# Patient Record
Sex: Female | Born: 1970 | Race: Black or African American | Hispanic: No | Marital: Single | Smoking: Current some day smoker
Health system: Southern US, Community
[De-identification: ages and names within clinical notes are randomized; demographics above are authoritative.]

## PROBLEM LIST (undated history)

## (undated) DIAGNOSIS — T7840XA Allergy, unspecified, initial encounter: Secondary | ICD-10-CM

## (undated) DIAGNOSIS — I10 Essential (primary) hypertension: Secondary | ICD-10-CM

## (undated) DIAGNOSIS — E785 Hyperlipidemia, unspecified: Secondary | ICD-10-CM

## (undated) DIAGNOSIS — D649 Anemia, unspecified: Secondary | ICD-10-CM

## (undated) DIAGNOSIS — R7989 Other specified abnormal findings of blood chemistry: Secondary | ICD-10-CM

## (undated) DIAGNOSIS — E559 Vitamin D deficiency, unspecified: Secondary | ICD-10-CM

## (undated) HISTORY — DX: Anemia, unspecified: D64.9

## (undated) HISTORY — DX: Other specified abnormal findings of blood chemistry: R79.89

## (undated) HISTORY — DX: Allergy, unspecified, initial encounter: T78.40XA

## (undated) HISTORY — DX: Vitamin D deficiency, unspecified: E55.9

## (undated) HISTORY — PX: OTHER SURGICAL HISTORY: SHX169

## (undated) HISTORY — DX: Hyperlipidemia, unspecified: E78.5

## (undated) HISTORY — PX: NO PAST SURGERIES: SHX2092

## (undated) HISTORY — DX: Essential (primary) hypertension: I10

---
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
## 2004-10-16 ENCOUNTER — Ambulatory Visit: Payer: Self-pay | Admitting: Internal Medicine

## 2012-12-30 LAB — HM PAP SMEAR: HM PAP: NORMAL

## 2016-03-20 ENCOUNTER — Telehealth: Payer: Self-pay | Admitting: Behavioral Health

## 2016-03-20 ENCOUNTER — Encounter: Payer: Self-pay | Admitting: Behavioral Health

## 2016-03-20 NOTE — Telephone Encounter (Signed)
Pre-Visit Call completed with patient and chart updated.   Pre-Visit Info documented in Specialty Comments under SnapShot.    

## 2016-03-21 ENCOUNTER — Ambulatory Visit (INDEPENDENT_AMBULATORY_CARE_PROVIDER_SITE_OTHER): Payer: BLUE CROSS/BLUE SHIELD | Admitting: Family Medicine

## 2016-03-21 ENCOUNTER — Encounter: Payer: Self-pay | Admitting: Family Medicine

## 2016-03-21 VITALS — BP 158/102 | HR 87 | Temp 98.2°F | Ht 67.5 in | Wt 176.6 lb

## 2016-03-21 DIAGNOSIS — I10 Essential (primary) hypertension: Secondary | ICD-10-CM

## 2016-03-21 DIAGNOSIS — Z91030 Bee allergy status: Secondary | ICD-10-CM

## 2016-03-21 DIAGNOSIS — Z131 Encounter for screening for diabetes mellitus: Secondary | ICD-10-CM | POA: Diagnosis not present

## 2016-03-21 DIAGNOSIS — Z130 Encounter for screening for diseases of the blood and blood-forming organs and certain disorders involving the immune mechanism: Secondary | ICD-10-CM | POA: Diagnosis not present

## 2016-03-21 DIAGNOSIS — Z13220 Encounter for screening for lipoid disorders: Secondary | ICD-10-CM | POA: Diagnosis not present

## 2016-03-21 DIAGNOSIS — Z634 Disappearance and death of family member: Secondary | ICD-10-CM

## 2016-03-21 DIAGNOSIS — D649 Anemia, unspecified: Secondary | ICD-10-CM

## 2016-03-21 MED ORDER — EPINEPHRINE 0.3 MG/0.3ML IJ SOAJ: 0.3000 mg | INTRAMUSCULAR | Status: DC | PRN

## 2016-03-21 MED ORDER — HYDROCHLOROTHIAZIDE 12.5 MG PO TABS: 12.5000 mg | ORAL_TABLET | Freq: Every day | ORAL | Status: DC

## 2016-03-21 NOTE — Progress Notes (Addendum)
[place] [facility] [address], [place], [place] [address] [phone]  Date:  03/21/2016   Name:  Helen Avila   DOB:  1971/12/20   MRN:  [id number]  PCP:  Lamar Blinks, MD    Chief Complaint: New Patient (Initial Visit)   History of Present Illness:  Helen Avila is a 45 y.o. very pleasant female patient who presents with the following:  Here today to res-establish care.  She got her health insurance back She has a history of HTN.   Her daughter died 5 years ago- her name was Windsor and she died from complications from her DM1. She left behind a daughter of her own who pt is raising (currently 44 yo)  Admits that she is just now feeling like she wants to take care of herself again which is why she is here today  She had been on HCTZ for her BP in the past This did work well for her, no SE noted She has had the HTN since about the time when her daughter passed.   She does not have any sx of her HTN- no HA, no chest pain, no SOB She has never had any heart problems.   There is an extensive family history of HTN  She does customer service work, non smoker non driver  She is single at this time. Her youngest daughter is 17 years old now, granddaughter 45 yo.   She last ate 5-6 hours ago    There are no active problems to display for this patient.   Past Medical History  Diagnosis Date  . Hypertension   . Anemia   . Low serum vitamin D   . Vitamin D deficiency     Past Surgical History  Procedure Laterality Date  . No past surgeries      Social History  Substance Use Topics  . Smoking status: Never Smoker   . Smokeless tobacco: Never Used  . Alcohol Use: 0.0 oz/week    0 Standard drinks or equivalent per week     Comment: wine once month    Family History  Problem Relation Age of Onset  . COPD Mother   . Congestive Heart Failure Mother   . Diabetes Daughter   . Cancer Maternal Aunt      Breast cancer  . Cancer Paternal Grandmother     Breast cancer  . Hypertension Neg Hx   . Liver disease Father     Allergies  Allergen Reactions  . Bee Venom Swelling  . Shellfish Allergy Swelling    Medication list has been reviewed and updated.  Current Outpatient Prescriptions on File Prior to Visit  Medication Sig Dispense Refill  . EPINEPHrine 0.3 mg/0.3 mL IJ SOAJ injection Inject 0.3 mg into the muscle as needed.     No current facility-administered medications on file prior to visit.    Review of Systems:  As per HPI- otherwise negative.   Physical Examination: Filed Vitals:   03/21/16 1435 03/21/16 1446  BP: 167/109 158/102  Pulse: 87   Temp: 98.2 F (36.8 C)    Filed Vitals:   03/21/16 1435  Height: 5' 7.5" (1.715 m)  Weight: 176 lb 9.6 oz (80.105 kg)   Body mass index is 27.24 kg/(m^2). Ideal Body Weight: Weight in (lb) to have BMI = 25: 161.7  GEN: WDWN, NAD, Non-toxic, A & O x 3, looks well HEENT: Atraumatic, Normocephalic. Neck supple.  No masses, No LAD. Ears and Nose: No external deformity. CV: RRR, No M/G/R. No JVD. No thrill. No extra heart sounds. PULM: CTA B, no wheezes, crackles, rhonchi. No retractions. No resp. distress. No accessory muscle use. EXTR: No c/c/e NEURO Normal gait.  PSYCH: Normally interactive. Conversant. Not depressed or anxious appearing.  Calm demeanor.    Assessment and Plan: Essential hypertension - Plan: TSH, hydrochlorothiazide (HYDRODIURIL) 12.5 MG tablet  Screening for hyperlipidemia - Plan: Lipid panel  Screening for diabetes mellitus - Plan: Comprehensive metabolic panel, Hemoglobin A1c  Screening for deficiency anemia - Plan: CBC  Bee allergy status - Plan: EPINEPHrine 0.3 mg/0.3 mL IJ SOAJ injection  Loss or death of child  Here today to establish care and get back on tx for her HTN Will check labs today and start her on hctz 12.5 mg. Plan to recheck in 3 months for labs and BP check, if she is  able to check her BP in the ambulatory setting that would also be great, goal < 140/90 Screening labs as above Refilled her epipen She is not currently attending any support group for bereaved parents but would be interested in this- suggested the compassionate friends or heartstrings and she will look into these options. She is not currently suffering from depression or acute grief  Signed Lamar Blinks, MD  Received her labs and called on 3/24-  Results for orders placed or performed in visit on 03/21/16  CBC  Result Value Ref Range   WBC 6.5 4.0 - 10.5 K/uL   RBC 3.62 (L) 3.87 - 5.11 Mil/uL   Platelets 403.0 (H) 150.0 - 400.0 K/uL   Hemoglobin 9.5 (L) 12.0 - 15.0 g/dL   HCT 29.9 (L) 36.0 - 46.0 %   MCV 82.6 78.0 - 100.0 fl   MCHC 31.9 30.0 - 36.0 g/dL   RDW 16.2 (H) 11.5 - 15.5 %  Comprehensive metabolic panel  Result Value Ref Range   Sodium 136 135 - 145 mEq/L   Potassium 3.9 3.5 - 5.1 mEq/L   Chloride 105 96 - 112 mEq/L   CO2 25 19 - 32 mEq/L   Glucose, Bld 67 (L) 70 - 99 mg/dL   BUN 8 6 - 23 mg/dL   Creatinine, Ser 0.73 0.40 - 1.20 mg/dL   Total Bilirubin 0.4 0.2 - 1.2 mg/dL   Alkaline Phosphatase 76 39 - 117 U/L   AST 15 0 - 37 U/L   ALT 9 0 - 35 U/L   Total Protein 7.1 6.0 - 8.3 g/dL   Albumin 3.8 3.5 - 5.2 g/dL   Calcium 8.9 8.4 - 10.5 mg/dL   GFR 111.35 >60.00 mL/min  Lipid panel  Result Value Ref Range   Cholesterol 224 (H) 0 - 200 mg/dL   Triglycerides 107.0 0.0 - 149.0 mg/dL   HDL 57.20 >39.00 mg/dL   VLDL 21.4 0.0 - 40.0 mg/dL   LDL Cholesterol 145 (H) 0 - 99 mg/dL   Total CHOL/HDL Ratio 4    NonHDL 166.61   TSH  Result Value Ref Range   TSH 0.95 0.35 - 4.50 uIU/mL  Hemoglobin A1c  Result Value Ref Range   Hgb A1c MFr Bld 5.2 4.6 - 6.5 %   Labs are fine except for anemia- she has heard this before.  Admits to heavy menses that last about 4 days.  Will set up a pelvic US to eval for fibroids.  She will start an MVI with iron.  Will likely need to  redraw blood unless I can add on a ferritin for her

## 2016-03-21 NOTE — Patient Instructions (Signed)
It was very nice to see you today I will be in touch with your labs asap! Start on the hctz once a day- morning is probably best  Let's plan to recheck in 3 months   If you are interested, the Compassionate Friends group offers support for bereaved parents. You can find a chapter near you online

## 2016-03-22 ENCOUNTER — Encounter: Payer: Self-pay | Admitting: Family Medicine

## 2016-03-22 LAB — LIPID PANEL
CHOL/HDL RATIO: 4
Cholesterol: 224 mg/dL — ABNORMAL HIGH (ref 0–200)
HDL: 57.2 mg/dL (ref >39.00)
LDL CALC: 145 mg/dL — AB (ref 0–99)
NONHDL: 166.61
Triglycerides: 107 mg/dL (ref 0.0–149.0)
VLDL: 21.4 mg/dL (ref 0.0–40.0)

## 2016-03-22 LAB — COMPREHENSIVE METABOLIC PANEL
ALT: 9 U/L (ref 0–35)
AST: 15 U/L (ref 0–37)
Albumin: 3.8 g/dL (ref 3.5–5.2)
Alkaline Phosphatase: 76 U/L (ref 39–117)
BILIRUBIN TOTAL: 0.4 mg/dL (ref 0.2–1.2)
BUN: 8 mg/dL (ref 6–23)
CHLORIDE: 105 meq/L (ref 96–112)
CO2: 25 meq/L (ref 19–32)
CREATININE: 0.73 mg/dL (ref 0.40–1.20)
Calcium: 8.9 mg/dL (ref 8.4–10.5)
GFR: 111.35 mL/min (ref >60.00)
GLUCOSE: 67 mg/dL — AB (ref 70–99)
Potassium: 3.9 mEq/L (ref 3.5–5.1)
Sodium: 136 mEq/L (ref 135–145)
Total Protein: 7.1 g/dL (ref 6.0–8.3)

## 2016-03-22 LAB — HEMOGLOBIN A1C: Hgb A1c MFr Bld: 5.2 % (ref 4.6–6.5)

## 2016-03-22 LAB — CBC
HCT: 29.9 % — ABNORMAL LOW (ref 36.0–46.0)
Hemoglobin: 9.5 g/dL — ABNORMAL LOW (ref 12.0–15.0)
MCHC: 31.9 g/dL (ref 30.0–36.0)
MCV: 82.6 fl (ref 78.0–100.0)
Platelets: 403 10*3/uL — ABNORMAL HIGH (ref 150.0–400.0)
RBC: 3.62 Mil/uL — AB (ref 3.87–5.11)
RDW: 16.2 % — AB (ref 11.5–15.5)
WBC: 6.5 10*3/uL (ref 4.0–10.5)

## 2016-03-22 LAB — TSH: TSH: 0.95 u[IU]/mL (ref 0.35–4.50)

## 2016-03-22 NOTE — Addendum Note (Signed)
Addended by: Lamar Blinks C on: 03/22/2016 01:39 PM   Modules accepted: Orders

## 2016-04-01 ENCOUNTER — Encounter: Payer: Self-pay | Admitting: Family Medicine

## 2016-04-05 ENCOUNTER — Other Ambulatory Visit: Payer: Self-pay

## 2016-04-05 DIAGNOSIS — D649 Anemia, unspecified: Secondary | ICD-10-CM

## 2016-04-17 ENCOUNTER — Ambulatory Visit (HOSPITAL_BASED_OUTPATIENT_CLINIC_OR_DEPARTMENT_OTHER): Payer: BLUE CROSS/BLUE SHIELD

## 2016-04-18 ENCOUNTER — Ambulatory Visit (HOSPITAL_BASED_OUTPATIENT_CLINIC_OR_DEPARTMENT_OTHER)
Admission: RE | Admit: 2016-04-18 | Discharge: 2016-04-18 | Disposition: A | Discharge status: Home / Self Care | Payer: BLUE CROSS/BLUE SHIELD | Source: Ambulatory Visit | Attending: Family Medicine | Admitting: Family Medicine

## 2016-04-18 DIAGNOSIS — D649 Anemia, unspecified: Secondary | ICD-10-CM

## 2016-04-24 ENCOUNTER — Ambulatory Visit (HOSPITAL_BASED_OUTPATIENT_CLINIC_OR_DEPARTMENT_OTHER): Payer: BLUE CROSS/BLUE SHIELD

## 2016-04-25 ENCOUNTER — Ambulatory Visit (HOSPITAL_BASED_OUTPATIENT_CLINIC_OR_DEPARTMENT_OTHER)
Admission: RE | Admit: 2016-04-25 | Discharge: 2016-04-25 | Disposition: A | Discharge status: Home / Self Care | Payer: BLUE CROSS/BLUE SHIELD | Source: Ambulatory Visit | Attending: Family Medicine | Admitting: Family Medicine

## 2016-04-25 DIAGNOSIS — D259 Leiomyoma of uterus, unspecified: Secondary | ICD-10-CM | POA: Diagnosis not present

## 2016-04-25 DIAGNOSIS — D649 Anemia, unspecified: Secondary | ICD-10-CM | POA: Diagnosis not present

## 2016-04-26 ENCOUNTER — Telehealth: Payer: Self-pay | Admitting: Family Medicine

## 2016-04-26 DIAGNOSIS — Z1239 Encounter for other screening for malignant neoplasm of breast: Secondary | ICD-10-CM

## 2016-04-26 DIAGNOSIS — N920 Excessive and frequent menstruation with regular cycle: Secondary | ICD-10-CM

## 2016-04-26 NOTE — Telephone Encounter (Signed)
Called and discussed with pt.  She would like to see OBG about suspected menorrhagia induced anemia.  I will arrange this.  She also asks for a referral for a mammogram

## 2016-05-22 ENCOUNTER — Encounter: Payer: BLUE CROSS/BLUE SHIELD | Admitting: Family Medicine

## 2016-06-05 ENCOUNTER — Encounter: Payer: Self-pay | Admitting: Family Medicine

## 2016-06-05 ENCOUNTER — Ambulatory Visit (INDEPENDENT_AMBULATORY_CARE_PROVIDER_SITE_OTHER): Payer: BLUE CROSS/BLUE SHIELD | Admitting: Family Medicine

## 2016-06-05 VITALS — BP 179/109 | HR 78 | Ht 67.0 in | Wt 164.0 lb

## 2016-06-05 DIAGNOSIS — I10 Essential (primary) hypertension: Secondary | ICD-10-CM | POA: Diagnosis not present

## 2016-06-05 DIAGNOSIS — D500 Iron deficiency anemia secondary to blood loss (chronic): Secondary | ICD-10-CM | POA: Diagnosis not present

## 2016-06-05 DIAGNOSIS — D259 Leiomyoma of uterus, unspecified: Secondary | ICD-10-CM | POA: Diagnosis not present

## 2016-06-05 DIAGNOSIS — D649 Anemia, unspecified: Secondary | ICD-10-CM | POA: Insufficient documentation

## 2016-06-05 NOTE — Progress Notes (Signed)
   Subjective:    Patient ID: Helen Avila, female    DOB: 14-Jul-1971, 45 y.o.   MRN: [id number]  HPI Patient referred for uterine fibroids and anemia.  The patient was found to be anemic after routine blood work was done.  Her doctor ordered a pelvic ultrasound, which showed two uterine fibroids: approximately 3.9 and 2.9 cm in diameter.  She was then referred her.  She denies heavy menses.  Over the past year, she will occasionally skip a month.  Menses lasts for 3 days and is described as moderate flow.     I have reviewed the patients past medical, family, and social history.  I have reviewed the patient's medication list and allergies.  Review of Systems  Gastrointestinal: Negative for nausea, abdominal pain and diarrhea.  Genitourinary: Negative for dysuria, vaginal bleeding, vaginal discharge and vaginal pain.  All other systems reviewed and are negative.      Objective:   Physical Exam  Constitutional: She is oriented to person, place, and time. She appears well-developed and well-nourished.  HENT:  Head: Normocephalic and atraumatic.  Right Ear: External ear normal.  Left Ear: External ear normal.  Eyes: Conjunctivae are normal. Pupils are equal, round, and reactive to light.  Cardiovascular: Normal rate, regular rhythm, normal heart sounds and intact distal pulses.  Exam reveals no gallop and no friction rub.   No murmur heard. Pulmonary/Chest: Effort normal and breath sounds normal. No respiratory distress. She has no wheezes. She has no rales. She exhibits no tenderness.  Neurological: She is alert and oriented to person, place, and time.  Skin: Skin is warm and dry. No rash noted. No erythema. No pallor.  Psychiatric: She has a normal mood and affect. Her behavior is normal. Judgment and thought content normal.      Assessment & Plan:  1. Uterine leiomyoma, unspecified location No current complications.  It does appear that the patient is perimenopausal.  I did  discuss that she should notify us of heavy bleeding, prolonged bleeding, breakthrough/intermenstrual bleeding, or pelvic pain.  I did discuss contraception management with her.  Patient to schedule annual exam.  2. Essential hypertension Elevated today.  3. Iron deficiency anemia due to chronic blood loss Continue iron supplementation.

## 2016-06-10 ENCOUNTER — Ambulatory Visit: Payer: BLUE CROSS/BLUE SHIELD | Admitting: Family Medicine

## 2016-06-10 ENCOUNTER — Ambulatory Visit (HOSPITAL_BASED_OUTPATIENT_CLINIC_OR_DEPARTMENT_OTHER): Payer: BLUE CROSS/BLUE SHIELD

## 2016-06-10 DIAGNOSIS — Z01419 Encounter for gynecological examination (general) (routine) without abnormal findings: Secondary | ICD-10-CM

## 2016-06-24 ENCOUNTER — Ambulatory Visit: Payer: BLUE CROSS/BLUE SHIELD | Admitting: Family Medicine

## 2016-06-28 ENCOUNTER — Encounter: Payer: Self-pay | Admitting: Family Medicine

## 2017-06-01 ENCOUNTER — Other Ambulatory Visit: Payer: Self-pay | Admitting: Family Medicine

## 2017-06-01 DIAGNOSIS — I10 Essential (primary) hypertension: Secondary | ICD-10-CM

## 2017-06-04 ENCOUNTER — Other Ambulatory Visit: Payer: Self-pay | Admitting: Emergency Medicine

## 2017-06-04 DIAGNOSIS — I10 Essential (primary) hypertension: Secondary | ICD-10-CM

## 2017-06-04 MED ORDER — HYDROCHLOROTHIAZIDE 12.5 MG PO TABS: 12.5000 mg | ORAL_TABLET | Freq: Every day | ORAL | 0 refills | Status: DC

## 2017-07-14 ENCOUNTER — Other Ambulatory Visit: Payer: Self-pay | Admitting: Family Medicine

## 2017-07-14 DIAGNOSIS — I10 Essential (primary) hypertension: Secondary | ICD-10-CM

## 2018-01-08 ENCOUNTER — Ambulatory Visit (INDEPENDENT_AMBULATORY_CARE_PROVIDER_SITE_OTHER): Payer: Managed Care, Other (non HMO) | Admitting: Obstetrics and Gynecology

## 2018-01-08 ENCOUNTER — Encounter: Payer: Self-pay | Admitting: Obstetrics and Gynecology

## 2018-01-08 VITALS — BP 174/109 | HR 101 | Ht 67.0 in | Wt 192.0 lb

## 2018-01-08 DIAGNOSIS — Z124 Encounter for screening for malignant neoplasm of cervix: Secondary | ICD-10-CM

## 2018-01-08 DIAGNOSIS — Z01419 Encounter for gynecological examination (general) (routine) without abnormal findings: Secondary | ICD-10-CM

## 2018-01-08 DIAGNOSIS — Z1151 Encounter for screening for human papillomavirus (HPV): Secondary | ICD-10-CM | POA: Diagnosis not present

## 2018-01-08 DIAGNOSIS — Z113 Encounter for screening for infections with a predominantly sexual mode of transmission: Secondary | ICD-10-CM

## 2018-01-08 DIAGNOSIS — I10 Essential (primary) hypertension: Secondary | ICD-10-CM

## 2018-01-08 MED ORDER — HYDROCHLOROTHIAZIDE 12.5 MG PO TABS: 12.5000 mg | ORAL_TABLET | Freq: Every day | ORAL | 0 refills | Status: DC

## 2018-01-08 NOTE — Progress Notes (Signed)
Patient reports having a MMG through work Nov 13 2017. Kathrene Alu RN

## 2018-01-08 NOTE — Progress Notes (Signed)
Subjective:     Helen Avila is a 47 y.o. female G79P2011 with BMI 30 who is here for a comprehensive physical exam. The patient reports no problems. Patient is sexually active using condoms for contraception. Last sexual encounter was 10 months ago. She reports an irregular period lasting 5 days. She states she started skipping periods over the past year. She states that she is physically active, jogging daily for 30-40 minutes. She reports some vaginal irritation without discharge or odor. She states she had a mammogram in November through [facility] (records requested). She reports some occasional hot flushes and night sweats. She is without any other complaints  Past Medical History:  Diagnosis Date  . Anemia   . Hypertension   . Low serum vitamin D   . Vitamin D deficiency    Past Surgical History:  Procedure Laterality Date  . NO PAST SURGERIES     Family History  Problem Relation Age of Onset  . COPD Mother   . Congestive Heart Failure Mother   . Diabetes Daughter   . Cancer Maternal Aunt        Breast cancer  . Cancer Paternal Grandmother        Breast cancer  . Hypertension Neg Hx   . Liver disease Father     Social History   Socioeconomic History  . Marital status: Single    Spouse name: Not on file  . Number of children: 2  . Years of education: Assoc.   . Highest education level: Not on file  Social Needs  . Financial resource strain: Not on file  . Food insecurity - worry: Not on file  . Food insecurity - inability: Not on file  . Transportation needs - medical: Not on file  . Transportation needs - non-medical: Not on file  Occupational History  . Occupation: Research scientist (physical sciences): OTHER    Comment: [employer]  Tobacco Use  . Smoking status: Never Smoker  . Smokeless tobacco: Never Used  Substance and Sexual Activity  . Alcohol use: Yes    Alcohol/week: 0.0 oz    Comment: wine once month  . Drug use: Yes    Types:  Marijuana    Comment: weekends  . Sexual activity: Yes    Birth control/protection: Condom  Other Topics Concern  . Not on file  Social History Narrative   Patient lives at family.   Caffeine Use: 5 cups daily   Health Maintenance  Topic Date Due  . HIV Screening  03/02/1987  . PAP SMEAR  12/31/2015  . INFLUENZA VACCINE  07/30/2017  . TETANUS/TDAP  12/30/2020       Review of Systems Pertinent items are noted in HPI.   Objective:  Blood pressure (!) 174/109, pulse (!) 101, height 5\' 7"  (1.702 m), weight 192 lb (87.1 kg), last menstrual period 12/01/2017.     GENERAL: Well-developed, well-nourished female in no acute distress.  HEENT: Normocephalic, atraumatic. Sclerae anicteric.  NECK: Supple. Normal thyroid.  LUNGS: Clear to auscultation bilaterally.  HEART: Regular rate and rhythm. BREASTS: Symmetric in size. No palpable masses or lymphadenopathy, skin changes, or nipple drainage. ABDOMEN: Soft, nontender, nondistended. No organomegaly. PELVIC: Normal external female genitalia. Vagina is pink and rugated.  Normal discharge. Normal appearing cervix. Uterus is normal in size. No adnexal mass or tenderness.  EXTREMITIES: No cyanosis, clubbing, or edema, 2+ distal pulses.     Assessment:    Healthy female exam likely in  perimenopausal state.      Plan:    Pap smear collected Screening mammogram records requested Patient advised to perform monthly self breast and vulva exam Patient will be contacted with abnormal results Patient to follow up with PCP ASAP for HTN. Refill on HCTZ provided Encouraged patient to continue her physical activity and to modify her diet RTC in 1 year or prn See After Visit Summary for Counseling Recommendations

## 2018-01-11 NOTE — Progress Notes (Deleted)
[place] at [facility] [address] [place], [place] [address] [phone]  Date:  01/14/2018   Name:  Helen Avila   DOB:  1971-06-25   MRN:  [id number]  PCP:  Darreld Mclean, MD    Chief Complaint: No chief complaint on file.   History of Present Illness:  Helen Avila is a 47 y.o. very pleasant female patient who presents with the following:  Here today with concern about her BP- I last saw her in March of 2017:  Here today to res-establish care.  She got her health insurance back She has a history of HTN.   Her daughter died 5 years ago- her name was Helen Avila and she died from complications from her DM1. She left behind a daughter of her own who pt is raising (currently 46 yo)  Admits that she is just now feeling like she wants to take care of herself again which is why she is here today  She had been on HCTZ for her BP in the past This did work well for her, no SE noted She has had the HTN since about the time when her daughter passed.   She does not have any sx of her HTN- no HA, no chest pain, no SOB She has never had any heart problems.   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 Her youngest daughter is 45 years old now, granddaughter 41 yo.    She was noted to be anemic at our last visit and will need follow-up of this issue today Labs are about 46 years old now Flu: Patient Active Problem List   Diagnosis Date Noted  . Anemia 06/05/2016  . Essential hypertension 12-Apr-2016  . Bee allergy status April 12, 2016  . Loss or death of child April 12, 2016    Past Medical History:  Diagnosis Date  . Anemia   . Hypertension   . Low serum vitamin D   . Vitamin D deficiency     Past Surgical History:  Procedure Laterality Date  . NO PAST SURGERIES      Social History   Tobacco Use  . Smoking status: Never Smoker   . Smokeless tobacco: Never Used  Substance Use Topics  . Alcohol use: Yes    Alcohol/week: 0.0 oz    Comment: wine once month  . Drug use: Yes    Types: Marijuana    Comment: weekends    Family History  Problem Relation Age of Onset  . COPD Mother   . Congestive Heart Failure Mother   . Liver disease Father   . Diabetes Daughter   . Cancer Maternal Aunt        Breast cancer  . Cancer Paternal Grandmother        Breast cancer  . Hypertension Neg Hx     Allergies  Allergen Reactions  . Bee Venom Swelling  . Shellfish Allergy Swelling    Medication list has been reviewed and updated.  Current Outpatient Medications on File Prior to Visit  Medication Sig Dispense Refill  . EPINEPHrine 0.3 mg/0.3 mL IJ SOAJ injection Inject 0.3 mLs (0.3 mg total) into the muscle as needed. [facility] generic device ok. (Patient not taking: Reported on 01/08/2018) 1 Device prn  . hydrochlorothiazide (HYDRODIURIL) 12.5 MG tablet Take 1 tablet (12.5 mg total) by mouth daily. 30 tablet 0  No current facility-administered medications on file prior to visit.     Review of Systems:  As per HPI- otherwise negative.   Physical Examination: There were no vitals filed for this visit. There were no vitals filed for this visit. There is no height or weight on file to calculate BMI. Ideal Body Weight:    GEN: WDWN, NAD, Non-toxic, A & O x 3 HEENT: Atraumatic, Normocephalic. Neck supple. No masses, No LAD. Ears and Nose: No external deformity. CV: RRR, No M/G/R. No JVD. No thrill. No extra heart sounds. PULM: CTA B, no wheezes, crackles, rhonchi. No retractions. No resp. distress. No accessory muscle use. ABD: S, NT, ND, +BS. No rebound. No HSM. EXTR: No c/c/e NEURO Normal gait.  PSYCH: Normally interactive. Conversant. Not depressed or anxious appearing.  Calm demeanor.    Assessment and Plan: ***  Signed Lamar Blinks, MD

## 2018-01-12 ENCOUNTER — Other Ambulatory Visit: Payer: Self-pay | Admitting: Obstetrics and Gynecology

## 2018-01-12 LAB — CYTOLOGY - PAP
BACTERIAL VAGINITIS: POSITIVE — AB
CHLAMYDIA, DNA PROBE: NEGATIVE
Candida vaginitis: NEGATIVE
Diagnosis: NEGATIVE
HPV (WINDOPATH): NOT DETECTED
NEISSERIA GONORRHEA: NEGATIVE
Trichomonas: POSITIVE — AB

## 2018-01-12 MED ORDER — METRONIDAZOLE 500 MG PO TABS: 500.0000 mg | ORAL_TABLET | Freq: Two times a day (BID) | ORAL | 0 refills | Status: DC

## 2018-01-13 ENCOUNTER — Telehealth: Payer: Self-pay

## 2018-01-13 NOTE — Telephone Encounter (Signed)
Patient made aware she has bacterial vaginosis and trichomonas. Patient states that she did pick up the prescription yesterday for the flagyl. Advised to avoid alcohol while on medication.  Patient also made aware she should abstain from any intercourse before her partner is tested. Kathrene Alu RNBSN

## 2018-01-13 NOTE — Telephone Encounter (Signed)
-----   Message from Mora Bellman, MD sent at 01/12/2018  3:50 PM EST ----- Please inform patient of both BV and trich infection. Rx flagyl has been [facility]prescribed to [facility] [address]

## 2018-01-14 ENCOUNTER — Ambulatory Visit: Payer: Managed Care, Other (non HMO) | Admitting: Family Medicine

## 2018-02-08 ENCOUNTER — Other Ambulatory Visit: Payer: Self-pay | Admitting: Obstetrics and Gynecology

## 2018-02-08 DIAGNOSIS — I10 Essential (primary) hypertension: Secondary | ICD-10-CM

## 2018-03-17 ENCOUNTER — Other Ambulatory Visit: Payer: Self-pay | Admitting: Obstetrics and Gynecology

## 2018-03-17 DIAGNOSIS — I10 Essential (primary) hypertension: Secondary | ICD-10-CM

## 2018-03-17 NOTE — Telephone Encounter (Signed)
Patient to follow up with PCP for further refills

## 2020-04-28 ENCOUNTER — Ambulatory Visit: Payer: Managed Care, Other (non HMO) | Admitting: Family Medicine

## 2020-05-04 ENCOUNTER — Ambulatory Visit: Payer: Managed Care, Other (non HMO) | Admitting: Family Medicine

## 2020-06-21 ENCOUNTER — Ambulatory Visit: Payer: Managed Care, Other (non HMO) | Admitting: Family Medicine

## 2020-07-04 ENCOUNTER — Other Ambulatory Visit: Payer: Self-pay

## 2020-07-05 ENCOUNTER — Ambulatory Visit (INDEPENDENT_AMBULATORY_CARE_PROVIDER_SITE_OTHER): Payer: Managed Care, Other (non HMO) | Admitting: Family Medicine

## 2020-07-05 ENCOUNTER — Encounter: Payer: Self-pay | Admitting: Family Medicine

## 2020-07-05 VITALS — BP 145/110 | HR 83 | Temp 97.6°F | Ht 66.25 in | Wt 196.6 lb

## 2020-07-05 DIAGNOSIS — E559 Vitamin D deficiency, unspecified: Secondary | ICD-10-CM

## 2020-07-05 DIAGNOSIS — Z1211 Encounter for screening for malignant neoplasm of colon: Secondary | ICD-10-CM

## 2020-07-05 DIAGNOSIS — Z91030 Bee allergy status: Secondary | ICD-10-CM | POA: Diagnosis not present

## 2020-07-05 DIAGNOSIS — Z0000 Encounter for general adult medical examination without abnormal findings: Secondary | ICD-10-CM

## 2020-07-05 DIAGNOSIS — F4321 Adjustment disorder with depressed mood: Secondary | ICD-10-CM

## 2020-07-05 DIAGNOSIS — I10 Essential (primary) hypertension: Secondary | ICD-10-CM | POA: Diagnosis not present

## 2020-07-05 DIAGNOSIS — Z1231 Encounter for screening mammogram for malignant neoplasm of breast: Secondary | ICD-10-CM

## 2020-07-05 DIAGNOSIS — F4320 Adjustment disorder, unspecified: Secondary | ICD-10-CM

## 2020-07-05 MED ORDER — EPINEPHRINE 0.3 MG/0.3ML IJ SOAJ: 0.3000 mg | INTRAMUSCULAR | 1 refills | Status: DC | PRN

## 2020-07-05 MED ORDER — HYDROCHLOROTHIAZIDE 25 MG PO TABS: 25.0000 mg | ORAL_TABLET | Freq: Every day | ORAL | 3 refills | Status: DC

## 2020-07-05 NOTE — Progress Notes (Signed)
Helen Avila is a 49 y.o. female  Chief Complaint  Patient presents with  . Establish Care    Pt here for a physical today. Pt not fasting for lab work.. Pt need labs to go through [facility].  Pt would like a referral for a mammogram and counseling.  Pt also would like to discuss bp medication and c/o fluid in lt ear.    HPI: Helen Avila is a 48 y.o. female here as a new patient with our office for annual CPE. She is not fasting for labs today.  She needs referral for mammo.  Her granddaughter who pt was raising passed away last month at 48yo from ? sudden cardiac death. She also lost her daughter years ago. She would like to have info re: grief counseling  Last PAP: GYN - Dr. Mora Bellman [facility]) Last mammo: 10/2017 Last colonoscopy: due   Diet/Exercise: chocolate and cookies are pts downfall, but otherwise she eats salads and drinks smoothies. She does eat fried food Dental: UTD, [facility]: pt wears glasses, has appt scheduled    Past Medical History:  Diagnosis Date  . Anemia   . Hypertension   . Low serum vitamin D   . Vitamin D deficiency     Past Surgical History:  Procedure Laterality Date  . Child birth  [phone]  . NO PAST SURGERIES    . WISDOM TOOTH EXTRACTION  1992    Social History   Socioeconomic History  . Marital status: Single    Spouse name: Not on file  . Number of children: 2  . Years of education: Assoc.   . Highest education level: Not on file  Occupational History  . Occupation: Research scientist (physical sciences): OTHER    Comment: [employer]  Tobacco Use  . Smoking status: Never Smoker  . Smokeless tobacco: Never Used  Substance and Sexual Activity  . Alcohol use: Yes    Alcohol/week: 0.0 standard drinks    Comment: wine once month  . Drug use: Yes    Types: Marijuana    Comment: weekends  . Sexual activity: Yes    Birth control/protection: Condom  Other Topics Concern  . Not on file  Social History  Narrative   Patient lives at family.   Caffeine Use: 5 cups daily   Social Determinants of Health   Financial Resource Strain:   . Difficulty of Paying Living Expenses:   Food Insecurity:   . Worried About Charity fundraiser in the Last Year:   . Arboriculturist in the Last Year:   Transportation Needs:   . Film/video editor (Medical):   Marland Kitchen Lack of Transportation (Non-Medical):   Physical Activity:   . Days of Exercise per Week:   . Minutes of Exercise per Session:   Stress:   . Feeling of Stress :   Social Connections:   . Frequency of Communication with Friends and Family:   . Frequency of Social Gatherings with Friends and Family:   . Attends Religious Services:   . Active Member of Clubs or Organizations:   . Attends Archivist Meetings:   Marland Kitchen Marital Status:   Intimate Partner Violence:   . Fear of Current or Ex-Partner:   . Emotionally Abused:   Marland Kitchen Physically Abused:   . Sexually Abused:     Family History  Problem Relation Age of Onset  . COPD Mother   . Congestive Heart Failure Mother   .  Liver disease Father   . Diabetes Daughter   . Cancer Maternal Aunt        Breast cancer  . Cancer Paternal Grandmother        Breast cancer  . Hypertension Neg Hx      Immunization History  Administered Date(s) Administered  . Td 12/30/2010    Outpatient Encounter Medications as of 07/05/2020  Medication Sig  . EPINEPHrine 0.3 mg/0.3 mL IJ SOAJ injection Inject 0.3 mLs (0.3 mg total) into the muscle as needed. [facility] generic device ok.  . [DISCONTINUED] EPINEPHrine 0.3 mg/0.3 mL IJ SOAJ injection Inject 0.3 mLs (0.3 mg total) into the muscle as needed. [facility] generic device ok.  Marland Kitchen amLODipine (NORVASC) 10 MG tablet Take 10 mg by mouth daily. (Patient not taking: Reported on 07/05/2020)  . hydrochlorothiazide (HYDRODIURIL) 12.5 MG tablet TAKE 1 TABLET BY MOUTH EVERY DAY (Patient not taking: Reported on 07/05/2020)  . metroNIDAZOLE (FLAGYL) 500 MG tablet Take 1 tablet  (500 mg total) by mouth 2 (two) times daily. (Patient not taking: Reported on 07/05/2020)   No facility-administered encounter medications on file as of 07/05/2020.     ROS: Gen: no fever, chills  Skin: no rash, itching ENT: no ear pain, ear drainage, nasal congestion, rhinorrhea, sinus pressure, sore throat Eyes: no blurry vision, double vision Resp: no cough, wheeze,SOB CV: no CP, palpitations, LE edema,  GI: no heartburn, n/v/d/c, abd pain GU: no dysuria, urgency, frequency, hematuria MSK: no joint pain, myalgias, back pain Neuro: no dizziness, headache, weakness, vertigo Psych: as above in HPI   Allergies  Allergen Reactions  . Bee Venom Swelling  . Shellfish Allergy Swelling    BP (!) 145/110 (BP Location: Right Arm, Patient Position: Sitting, Cuff Size: Normal)   Pulse 83   Temp 97.6 F (36.4 C) (Temporal)   Ht 5' 6.25" (1.683 m)   Wt 196 lb 9.6 oz (89.2 kg)   LMP  (LMP Unknown)   SpO2 97%   BMI 31.49 kg/m    BP Readings from Last 3 Encounters:  07/05/20 (!) 145/110  01/08/18 (!) 174/109  06/05/16 (!) 179/109   Pulse Readings from Last 3 Encounters:  07/05/20 83  01/08/18 (!) 101  06/05/16 78   Wt Readings from Last 3 Encounters:  07/05/20 196 lb 9.6 oz (89.2 kg)  01/08/18 192 lb (87.1 kg)  06/05/16 164 lb (74.4 kg)     Physical Exam Constitutional:      General: She is not in acute distress.    Appearance: She is well-developed.  HENT:     Head: Normocephalic and atraumatic.     Right Ear: Tympanic membrane and ear canal normal.     Left Ear: Tympanic membrane and ear canal normal.     Nose: Nose normal.  Eyes:     Conjunctiva/sclera: Conjunctivae normal.     Pupils: Pupils are equal, round, and reactive to light.  Neck:     Thyroid: No thyromegaly.  Cardiovascular:     Rate and Rhythm: Normal rate and regular rhythm.     Heart sounds: Normal heart sounds. No murmur heard.   Pulmonary:     Effort: Pulmonary effort is normal. No respiratory  distress.     Breath sounds: Normal breath sounds. No wheezing or rhonchi.  Abdominal:     General: Bowel sounds are normal. There is no distension.     Palpations: Abdomen is soft. There is no mass.     Tenderness: There is no abdominal tenderness.  Musculoskeletal:     Cervical back: Neck supple.  Lymphadenopathy:     Cervical: No cervical adenopathy.  Skin:    General: Skin is warm and dry.  Neurological:     Mental Status: She is alert and oriented to person, place, and time.     Motor: No abnormal muscle tone.     Coordination: Coordination normal.  Psychiatric:        Behavior: Behavior normal.      A/P:  1. Annual physical exam - UTD on dental exam, has vision exam scheduled - PAP with GYN Dr. Elly Modena - mammo and colo referrals placed today - discussed importance of regular CV exercise, healthy diet, adequate sleep - AST; Future - Basic metabolic panel; Future - VITAMIN D 25 Hydroxy (Vit-D Deficiency, Fractures); Future - Lipid panel; Future - ALT; Future - next CPE in 1 year  2. Essential hypertension - uncontrolled, not on meds - Basic metabolic panel; Future - low sodium diet Rx: - hydrochlorothiazide (HYDRODIURIL) 25 MG tablet; Take 1 tablet (25 mg total) by mouth daily.  Dispense: 90 tablet; Refill: 3 - f/u in 2-3 wks  3. Vitamin D deficiency - VITAMIN D 25 Hydroxy (Vit-D Deficiency, Fractures); Future  4. Bee allergy status Refill: - EPINEPHrine 0.3 mg/0.3 mL IJ SOAJ injection; Inject 0.3 mLs (0.3 mg total) into the muscle as needed. CVS generic device ok.  Dispense: 1 each; Refill: 1  5. Encounter for screening mammogram for malignant neoplasm of breast - MM DIGITAL SCREENING BILATERAL; Future  6. Screening for colon cancer - Ambulatory referral to Gastroenterology   7. Grief reaction - pt requests info re: grief counseling so discussed with pt and included info in AVS  This visit occurred during the SARS-CoV-2 public health emergency.  Safety  protocols were in place, including screening questions prior to the visit, additional usage of staff PPE, and extensive cleaning of exam room while observing appropriate contact time as indicated for disinfecting solutions.

## 2020-07-05 NOTE — Patient Instructions (Addendum)
Health Maintenance Due  Topic Date Due  . Hepatitis C Screening  Never done  . COVID-19 Vaccine (1) Never done  . HIV Screening  Never done    No flowsheet data found.   compassionate friends - [email or web address]  Heartstrings - [email or web address]  [facility]: [email or web address]  [email or web address]  Patty Von [email or web address] [email or web address]  [facility] [email or web address]  [email or web address]   DASH Eating Plan DASH stands for "Dietary Approaches to Stop Hypertension." The DASH eating plan is a healthy eating plan that has been shown to reduce high blood pressure (hypertension). It may also reduce your risk for type 2 diabetes, heart disease, and stroke. The DASH eating plan may also help with weight loss. What are tips for following this plan?  General guidelines  Avoid eating more than 2,300 mg (milligrams) of salt (sodium) a day. If you have hypertension, you may need to reduce your sodium intake to 1,500 mg a day.  Limit alcohol intake to no more than 1 drink a day for nonpregnant women and 2 drinks a day for men. One drink equals 12 oz of beer, 5 oz of wine, or 1 oz of hard liquor.  Work with your health care provider to maintain a healthy body weight or to lose weight. Ask what an ideal weight is for you.  Get at least 30 minutes of exercise that causes your heart to beat faster (aerobic exercise) most days of the week. Activities may include walking, swimming, or biking.  Work with your health care provider or diet and nutrition specialist (dietitian) to adjust your eating plan to your individual calorie needs. Reading food labels   Check food labels for the amount of sodium per serving. Choose foods with less than 5 percent of the Daily Value of sodium. Generally, foods  with less than 300 mg of sodium per serving fit into this eating plan.  To find whole grains, look for the word "whole" as the first word in the ingredient list. Shopping  Buy products labeled as "low-sodium" or "no salt added."  Buy fresh foods. Avoid canned foods and premade or frozen meals. Cooking  Avoid adding salt when cooking. Use salt-free seasonings or herbs instead of table salt or sea salt. Check with your health care provider or pharmacist before using salt substitutes.  Do not fry foods. Cook foods using healthy methods such as baking, boiling, grilling, and broiling instead.  Cook with heart-healthy oils, such as olive, canola, soybean, or sunflower oil. Meal planning  Eat a balanced diet that includes: ? 5 or more servings of fruits and vegetables each day. At each meal, try to fill half of your plate with fruits and vegetables. ? Up to 6-8 servings of whole grains each day. ? Less than 6 oz of lean meat, poultry, or fish each day. A 3-oz serving of meat is about the same size as a deck of cards. One egg equals 1 oz. ? 2 servings of low-fat dairy each day. ? A serving of nuts, seeds, or beans 5 times each week. ? Heart-healthy fats. Healthy fats called Omega-3 fatty acids are found in foods such as flaxseeds and coldwater fish, like sardines, salmon, and mackerel.  Limit how much you eat of the following: ? Canned or prepackaged foods. ? Food that is high in trans fat, such as fried foods. ? Food that is high in saturated fat, such as fatty meat. ? Sweets, desserts, sugary drinks, and other foods with added sugar. ? Full-fat dairy  products.  Do not salt foods before eating.  Try to eat at least 2 vegetarian meals each week.  Eat more home-cooked food and less restaurant, buffet, and fast food.  When eating at a restaurant, ask that your food be prepared with less salt or no salt, if possible. What foods are recommended? The items listed may not be a complete  list. Talk with your dietitian about what dietary choices are best for you. Grains Whole-grain or whole-wheat bread. Whole-grain or whole-wheat pasta. Brown rice. Modena Morrow. Bulgur. Whole-grain and low-sodium cereals. Pita bread. Low-fat, low-sodium crackers. Whole-wheat flour tortillas. Vegetables Fresh or frozen vegetables (raw, steamed, roasted, or grilled). Low-sodium or reduced-sodium tomato and vegetable juice. Low-sodium or reduced-sodium tomato sauce and tomato paste. Low-sodium or reduced-sodium canned vegetables. Fruits All fresh, dried, or frozen fruit. Canned fruit in natural juice (without added sugar). Meat and other protein foods Skinless chicken or [place]. Ground chicken or [place]. Pork with fat trimmed off. Fish and seafood. Egg whites. Dried beans, peas, or lentils. Unsalted nuts, nut butters, and seeds. Unsalted canned beans. Lean cuts of beef with fat trimmed off. Low-sodium, lean deli meat. Dairy Low-fat (1%) or fat-free (skim) milk. Fat-free, low-fat, or reduced-fat cheeses. Nonfat, low-sodium ricotta or cottage cheese. Low-fat or nonfat yogurt. Low-fat, low-sodium cheese. Fats and oils Soft margarine without trans fats. Vegetable oil. Low-fat, reduced-fat, or light mayonnaise and salad dressings (reduced-sodium). Canola, safflower, olive, soybean, and sunflower oils. Avocado. Seasoning and other foods Herbs. Spices. Seasoning mixes without salt. Unsalted popcorn and pretzels. Fat-free sweets. What foods are not recommended? The items listed may not be a complete list. Talk with your dietitian about what dietary choices are best for you. Grains Baked goods made with fat, such as croissants, muffins, or some breads. Dry pasta or rice meal packs. Vegetables Creamed or fried vegetables. Vegetables in a cheese sauce. Regular canned vegetables (not low-sodium or reduced-sodium). Regular canned tomato sauce and paste (not low-sodium or reduced-sodium). Regular tomato and  vegetable juice (not low-sodium or reduced-sodium). Angie Fava. Olives. Fruits Canned fruit in a light or heavy syrup. Fried fruit. Fruit in cream or butter sauce. Meat and other protein foods Fatty cuts of meat. Ribs. Fried meat. Berniece Salines. Sausage. Bologna and other processed lunch meats. Salami. Fatback. Hotdogs. Bratwurst. Salted nuts and seeds. Canned beans with added salt. Canned or smoked fish. Whole eggs or egg yolks. Chicken or [place] with skin. Dairy Whole or 2% milk, cream, and half-and-half. Whole or full-fat cream cheese. Whole-fat or sweetened yogurt. Full-fat cheese. Nondairy creamers. Whipped toppings. Processed cheese and cheese spreads. Fats and oils Butter. Stick margarine. Lard. Shortening. Ghee. Bacon fat. Tropical oils, such as coconut, palm kernel, or palm oil. Seasoning and other foods Salted popcorn and pretzels. Onion salt, garlic salt, seasoned salt, table salt, and sea salt. Worcestershire sauce. Tartar sauce. Barbecue sauce. Teriyaki sauce. Soy sauce, including reduced-sodium. Steak sauce. Canned and packaged gravies. Fish sauce. Oyster sauce. Cocktail sauce. Horseradish that you find on the shelf. Ketchup. Mustard. Meat flavorings and tenderizers. Bouillon cubes. Hot sauce and Tabasco sauce. Premade or packaged marinades. Premade or packaged taco seasonings. Relishes. Regular salad dressings. Where to find more information:  National Heart, Lung, and [place]: https://wilson-eaton.com/  American Heart Association: www.heart.org Summary  The DASH eating plan is a healthy eating plan that has been shown to reduce high blood pressure (hypertension). It may also reduce your risk for type 2 diabetes, heart disease, and stroke.  With the DASH eating plan, you should limit  salt (sodium) intake to 2,300 mg a day. If you have hypertension, you may need to reduce your sodium intake to 1,500 mg a day.  When on the DASH eating plan, aim to eat more fresh fruits and vegetables, whole  grains, lean proteins, low-fat dairy, and heart-healthy fats.  Work with your health care provider or diet and nutrition specialist (dietitian) to adjust your eating plan to your individual calorie needs. This information is not intended to replace advice given to you by your health care provider. Make sure you discuss any questions you have with your health care provider. Document Revised: 11/28/2017 Document Reviewed: 12/09/2016 Elsevier Patient Education  2020 [employer].

## 2020-07-11 ENCOUNTER — Other Ambulatory Visit: Payer: Self-pay | Admitting: Family Medicine

## 2020-07-11 DIAGNOSIS — Z1231 Encounter for screening mammogram for malignant neoplasm of breast: Secondary | ICD-10-CM

## 2020-07-18 ENCOUNTER — Other Ambulatory Visit: Payer: Self-pay

## 2020-07-18 NOTE — Patient Instructions (Addendum)
Health Maintenance Due  Topic Date Due  . Hepatitis C Screening  Never done  . COVID-19 Vaccine (1) Never done  . HIV Screening  Never done    No flowsheet data found.   Check your BP 3x/wk x 2 wks and then call or send MyChart message with readings in about 2-3 wks  Cont on HCTZ 25mg  daily

## 2020-07-19 ENCOUNTER — Encounter: Payer: Self-pay | Admitting: Family Medicine

## 2020-07-19 ENCOUNTER — Ambulatory Visit (INDEPENDENT_AMBULATORY_CARE_PROVIDER_SITE_OTHER): Payer: Managed Care, Other (non HMO) | Admitting: Family Medicine

## 2020-07-19 VITALS — BP 130/90 | HR 102 | Temp 97.3°F | Ht 66.25 in | Wt 196.8 lb

## 2020-07-19 DIAGNOSIS — Z0000 Encounter for general adult medical examination without abnormal findings: Secondary | ICD-10-CM | POA: Diagnosis not present

## 2020-07-19 DIAGNOSIS — E559 Vitamin D deficiency, unspecified: Secondary | ICD-10-CM | POA: Diagnosis not present

## 2020-07-19 DIAGNOSIS — I10 Essential (primary) hypertension: Secondary | ICD-10-CM | POA: Diagnosis not present

## 2020-07-19 NOTE — Progress Notes (Signed)
Helen Avila is a 49 y.o. female  Chief Complaint  Patient presents with  . Hypertension    2 week follow up/lab work    HPI: Helen Avila is a 49 y.o. female here for HTN f/u and fasting labs. At last OV about 2 wks ago, pt was started on HCTZ 25mg  daily.  She has not been checking BP at home. She denies side effects. Denies HA, vision changes, CP, SOB, dizziness, lightheadedness.   Past Medical History:  Diagnosis Date  . Anemia   . Hypertension   . Low serum vitamin D   . Vitamin D deficiency     Past Surgical History:  Procedure Laterality Date  . Child birth  [phone]  . NO PAST SURGERIES    . WISDOM TOOTH EXTRACTION  1992    Social History   Socioeconomic History  . Marital status: Single    Spouse name: Not on file  . Number of children: 2  . Years of education: Assoc.   . Highest education level: Not on file  Occupational History  . Occupation: Research scientist (physical sciences): OTHER    Comment: [employer]  Tobacco Use  . Smoking status: Never Smoker  . Smokeless tobacco: Never Used  Substance and Sexual Activity  . Alcohol use: Yes    Alcohol/week: 0.0 standard drinks    Comment: wine once month  . Drug use: Yes    Types: Marijuana    Comment: weekends  . Sexual activity: Yes    Birth control/protection: Condom  Other Topics Concern  . Not on file  Social History Narrative   Patient lives at family.   Caffeine Use: 5 cups daily   Social Determinants of Health   Financial Resource Strain:   . Difficulty of Paying Living Expenses:   Food Insecurity:   . Worried About Charity fundraiser in the Last Year:   . Arboriculturist in the Last Year:   Transportation Needs:   . Film/video editor (Medical):   Marland Kitchen Lack of Transportation (Non-Medical):   Physical Activity:   . Days of Exercise per Week:   . Minutes of Exercise per Session:   Stress:   . Feeling of Stress :   Social Connections:   . Frequency of  Communication with Friends and Family:   . Frequency of Social Gatherings with Friends and Family:   . Attends Religious Services:   . Active Member of Clubs or Organizations:   . Attends Archivist Meetings:   Marland Kitchen Marital Status:   Intimate Partner Violence:   . Fear of Current or Ex-Partner:   . Emotionally Abused:   Marland Kitchen Physically Abused:   . Sexually Abused:     Family History  Problem Relation Age of Onset  . COPD Mother   . Congestive Heart Failure Mother   . Liver disease Father   . Diabetes Daughter   . Cancer Maternal Aunt        Breast cancer  . Cancer Paternal Grandmother        Breast cancer  . Hypertension Neg Hx      Immunization History  Administered Date(s) Administered  . Td 12/30/2010    Outpatient Encounter Medications as of 07/19/2020  Medication Sig  . EPINEPHrine 0.3 mg/0.3 mL IJ SOAJ injection Inject 0.3 mLs (0.3 mg total) into the muscle as needed. [facility] generic device ok.  . hydrochlorothiazide (HYDRODIURIL) 25 MG tablet Take 1 tablet (  25 mg total) by mouth daily.   No facility-administered encounter medications on file as of 07/19/2020.     ROS: Pertinent positives and negatives noted in HPI. Remainder of ROS non-contributory   Allergies  Allergen Reactions  . Bee Venom Swelling  . Shellfish Allergy Swelling    BP 130/90 (BP Location: Left Arm, Patient Position: Sitting, Cuff Size: Normal)   Pulse (!) 102   Temp (!) 97.3 F (36.3 C) (Temporal)   Ht 5' 6.25" (1.683 m)   Wt 196 lb 12.8 oz (89.3 kg)   LMP  (LMP Unknown)   SpO2 98%   BMI 31.53 kg/m   BP Readings from Last 3 Encounters:  07/19/20 130/90  07/05/20 (!) 145/110  01/08/18 (!) 174/109   Pulse Readings from Last 3 Encounters:  07/19/20 (!) 102  07/05/20 83  01/08/18 (!) 101   Wt Readings from Last 3 Encounters:  07/19/20 196 lb 12.8 oz (89.3 kg)  07/05/20 196 lb 9.6 oz (89.2 kg)  01/08/18 192 lb (87.1 kg)     Physical Exam Constitutional:      General:  She is not in acute distress.    Appearance: Normal appearance. She is not ill-appearing.  Cardiovascular:     Rate and Rhythm: Normal rate and regular rhythm.     Pulses: Normal pulses.     Heart sounds: Normal heart sounds.  Pulmonary:     Effort: Pulmonary effort is normal. No respiratory distress.  Musculoskeletal:     Right lower leg: No edema.     Left lower leg: No edema.  Neurological:     Mental Status: She is alert and oriented to person, place, and time.  Psychiatric:        Mood and Affect: Mood normal.        Behavior: Behavior normal.      A/P:  1. Essential hypertension - much-improved BP, DBP borderline but SBP at goal - pt to check BP at home 3x/wk x 2 wks then send readings via MyChart or call with readings - for now, cont HCTZ 25mg  daily   This visit occurred during the SARS-CoV-2 public health emergency.  Safety protocols were in place, including screening questions prior to the visit, additional usage of staff PPE, and extensive cleaning of exam room while observing appropriate contact time as indicated for disinfecting solutions.

## 2020-07-19 NOTE — Addendum Note (Signed)
Addended by: Lynnea Ferrier on: 07/19/2020 08:50 AM   Modules accepted: Orders

## 2020-07-20 LAB — BASIC METABOLIC PANEL
BUN/Creatinine Ratio: 12 (ref 9–23)
BUN: 13 mg/dL (ref 6–24)
CO2: 26 mmol/L (ref 20–29)
Calcium: 9.8 mg/dL (ref 8.7–10.2)
Chloride: 101 mmol/L (ref 96–106)
Creatinine, Ser: 1.05 mg/dL — ABNORMAL HIGH (ref 0.57–1.00)
GFR calc Af Amer: 73 mL/min/{1.73_m2} (ref >59)
GFR calc non Af Amer: 63 mL/min/{1.73_m2} (ref >59)
Glucose: 86 mg/dL (ref 65–99)
Potassium: 4.2 mmol/L (ref 3.5–5.2)
Sodium: 139 mmol/L (ref 134–144)

## 2020-07-20 LAB — LIPID PANEL
Chol/HDL Ratio: 6.4 ratio — ABNORMAL HIGH (ref 0.0–4.4)
Cholesterol, Total: 333 mg/dL — ABNORMAL HIGH (ref 100–199)
HDL: 52 mg/dL (ref >39)
LDL Chol Calc (NIH): 253 mg/dL — ABNORMAL HIGH (ref 0–99)
Triglycerides: 146 mg/dL (ref 0–149)
VLDL Cholesterol Cal: 28 mg/dL (ref 5–40)

## 2020-07-20 LAB — ALT: ALT: 25 IU/L (ref 0–32)

## 2020-07-20 LAB — VITAMIN D 25 HYDROXY (VIT D DEFICIENCY, FRACTURES): Vit D, 25-Hydroxy: 12.3 ng/mL — ABNORMAL LOW (ref 30.0–100.0)

## 2020-07-20 LAB — AST: AST: 25 IU/L (ref 0–40)

## 2020-07-21 ENCOUNTER — Other Ambulatory Visit: Payer: Self-pay | Admitting: Family Medicine

## 2020-07-21 DIAGNOSIS — E559 Vitamin D deficiency, unspecified: Secondary | ICD-10-CM

## 2020-07-21 DIAGNOSIS — E782 Mixed hyperlipidemia: Secondary | ICD-10-CM

## 2020-07-21 MED ORDER — ATORVASTATIN CALCIUM 40 MG PO TABS: 40.0000 mg | ORAL_TABLET | Freq: Every day | ORAL | 3 refills | Status: DC

## 2020-07-21 MED ORDER — VITAMIN D (ERGOCALCIFEROL) 1.25 MG (50000 UNIT) PO CAPS: 50000.0000 [IU] | ORAL_CAPSULE | ORAL | 2 refills | Status: DC

## 2020-07-25 ENCOUNTER — Other Ambulatory Visit: Payer: Self-pay

## 2020-07-25 ENCOUNTER — Ambulatory Visit
Admission: RE | Admit: 2020-07-25 | Discharge: 2020-07-25 | Disposition: A | Discharge status: Home / Self Care | Payer: Managed Care, Other (non HMO) | Source: Ambulatory Visit | Attending: Family Medicine | Admitting: Family Medicine

## 2020-07-25 DIAGNOSIS — Z1231 Encounter for screening mammogram for malignant neoplasm of breast: Secondary | ICD-10-CM

## 2020-07-28 ENCOUNTER — Telehealth: Payer: Self-pay | Admitting: Family Medicine

## 2020-07-28 DIAGNOSIS — E782 Mixed hyperlipidemia: Secondary | ICD-10-CM

## 2020-07-28 MED ORDER — ATORVASTATIN CALCIUM 40 MG PO TABS: 40.0000 mg | ORAL_TABLET | Freq: Every day | ORAL | 3 refills | Status: DC

## 2020-07-28 NOTE — Telephone Encounter (Signed)
Patient is calling to speak to staff regarding her Lipitor. She said that it was sent to the wrong pharmacy. Please send to [facility] in [facility].  Thank you

## 2020-07-28 NOTE — Telephone Encounter (Signed)
Sent to correct pharmacy today.

## 2020-08-01 ENCOUNTER — Telehealth: Payer: Self-pay | Admitting: Family Medicine

## 2020-08-01 ENCOUNTER — Other Ambulatory Visit: Payer: Self-pay

## 2020-08-01 DIAGNOSIS — E559 Vitamin D deficiency, unspecified: Secondary | ICD-10-CM

## 2020-08-01 MED ORDER — VITAMIN D (ERGOCALCIFEROL) 1.25 MG (50000 UNIT) PO CAPS: 50000.0000 [IU] | ORAL_CAPSULE | ORAL | 2 refills | Status: DC

## 2020-08-01 NOTE — Telephone Encounter (Signed)
Sent Vit D to correct pharmacy.  Lipitor was sent to correct pharmacy on 07/28/20.

## 2020-08-01 NOTE — Telephone Encounter (Signed)
Patient is calling and stated that her medications were sent to the wrong place. Patient is asking if Lipitor and Vitamin D can be sent to [facility] [address] in [place], please advise. CB is [phone].

## 2020-10-10 ENCOUNTER — Encounter: Payer: Self-pay | Admitting: Gastroenterology

## 2020-11-28 ENCOUNTER — Other Ambulatory Visit: Payer: Self-pay

## 2020-11-28 ENCOUNTER — Ambulatory Visit (AMBULATORY_SURGERY_CENTER): Payer: Self-pay | Admitting: *Deleted

## 2020-11-28 VITALS — Ht 66.25 in | Wt 204.0 lb

## 2020-11-28 DIAGNOSIS — Z01818 Encounter for other preprocedural examination: Secondary | ICD-10-CM

## 2020-11-28 DIAGNOSIS — Z1211 Encounter for screening for malignant neoplasm of colon: Secondary | ICD-10-CM

## 2020-11-28 MED ORDER — CLENPIQ 10-3.5-12 MG-GM -GM/160ML PO SOLN
1.0000 | ORAL | 0 refills | Status: DC
Start: 2020-11-28 — End: 2020-12-12

## 2020-11-28 NOTE — Progress Notes (Signed)
No egg or soy allergy known to patient  No issues with past sedation with any surgeries or procedures no intubation problems in the past  No FH of Malignant Hyperthermia No diet pills per patient No home 02 use per patient  No blood thinners per patient  Pt denies issues with constipation  No A fib or A flutter  EMMI video to pt or via [place] guidelines implemented in PV today with Pt and RN   [employer] given to pt in PV today , Code to Pharmacy   Due to the COVID-19 pandemic we are asking patients to follow these guidelines. Please only bring one care partner. Please be aware that your care partner may wait in the car in the parking lot or if they feel like they will be too hot to wait in the car, they may wait in the lobby on the 4th floor. All care partners are required to wear a mask the entire time (we do not have any that we can provide them), they need to practice social distancing, and we will do a Covid check for all patient's and care partners when you arrive. Also we will check their temperature and your temperature. If the care partner waits in their car they need to stay in the parking lot the entire time and we will call them on their cell phone when the patient is ready for discharge so they can bring the car to the front of the building. Also all patient's will need to wear a mask into building.

## 2020-11-29 ENCOUNTER — Encounter: Payer: Self-pay | Admitting: Gastroenterology

## 2020-12-08 ENCOUNTER — Other Ambulatory Visit: Payer: Self-pay | Admitting: Gastroenterology

## 2020-12-08 LAB — SARS CORONAVIRUS 2 (TAT 6-24 HRS): SARS Coronavirus 2: NEGATIVE

## 2020-12-12 ENCOUNTER — Other Ambulatory Visit: Payer: Self-pay

## 2020-12-12 ENCOUNTER — Encounter: Payer: Self-pay | Admitting: Gastroenterology

## 2020-12-12 ENCOUNTER — Ambulatory Visit (AMBULATORY_SURGERY_CENTER): Payer: Managed Care, Other (non HMO) | Admitting: Gastroenterology

## 2020-12-12 VITALS — BP 139/91 | HR 63 | Temp 98.0°F | Resp 14 | Ht 66.25 in | Wt 204.0 lb

## 2020-12-12 DIAGNOSIS — Z1211 Encounter for screening for malignant neoplasm of colon: Secondary | ICD-10-CM

## 2020-12-12 MED ORDER — SODIUM CHLORIDE 0.9 % IV SOLN: 500.0000 mL | Freq: Once | INTRAVENOUS | Status: DC

## 2020-12-12 NOTE — Patient Instructions (Signed)
YOU HAD AN ENDOSCOPIC PROCEDURE TODAY AT THE [facility]:   Refer to the procedure report that was given to you for any specific questions about what was found during the examination.  If the procedure report does not answer your questions, please call your gastroenterologist to clarify.  If you requested that your care partner not be given the details of your procedure findings, then the procedure report has been included in a sealed envelope for you to review at your convenience later.  YOU SHOULD EXPECT: Some feelings of bloating in the abdomen. Passage of more gas than usual.  Walking can help get rid of the air that was put into your GI tract during the procedure and reduce the bloating. If you had a lower endoscopy (such as a colonoscopy or flexible sigmoidoscopy) you may notice spotting of blood in your stool or on the toilet paper. If you underwent a bowel prep for your procedure, you may not have a normal bowel movement for a few days.  Please Note:  You might notice some irritation and congestion in your nose or some drainage.  This is from the oxygen used during your procedure.  There is no need for concern and it should clear up in a day or so.  SYMPTOMS TO REPORT IMMEDIATELY:   Following lower endoscopy (colonoscopy or flexible sigmoidoscopy):  Excessive amounts of blood in the stool  Significant tenderness or worsening of abdominal pains  Swelling of the abdomen that is new, acute  Fever of 100F or higher  For urgent or emergent issues, a gastroenterologist can be reached at any hour by calling [phone]. Do not use MyChart messaging for urgent concerns.    DIET:  We do recommend a small meal at first, but then you may proceed to your regular diet.  Drink plenty of fluids but you should avoid alcoholic beverages for 24 hours.  ACTIVITY:  You should plan to take it easy for the rest of today and you should NOT DRIVE or use heavy machinery until tomorrow (because  of the sedation medicines used during the test).    FOLLOW UP: Our staff will call the number listed on your records 48-72 hours following your procedure to check on you and address any questions or concerns that you may have regarding the information given to you following your procedure. If we do not reach you, we will leave a message.  We will attempt to reach you two times.  During this call, we will ask if you have developed any symptoms of COVID 19. If you develop any symptoms (ie: fever, flu-like symptoms, shortness of breath, cough etc.) before then, please call [phone].  If you test positive for Covid 19 in the 2 weeks post procedure, please call and report this information to us.    If any biopsies were taken you will be contacted by phone or by letter within the next 1-3 weeks.  Please call us at [phone] if you have not heard about the biopsies in 3 weeks.    SIGNATURES/CONFIDENTIALITY: You and/or your care partner have signed paperwork which will be entered into your electronic medical record.  These signatures attest to the fact that that the information above on your After Visit Summary has been reviewed and is understood.  Full responsibility of the confidentiality of this discharge information lies with you and/or your care-partner. 

## 2020-12-12 NOTE — Op Note (Signed)
[place] Patient Name: Helen Avila Procedure Date: 12/12/2020 7:20 AM MRN: [id number] Endoscopist: Gerrit Heck , MD Age: 49 Referring MD:  Date of Birth: [birth year] Gender: Female Account #: [id number] Procedure:                Colonoscopy Indications:              Screening for colorectal malignant neoplasm, This                            is the patient's first colonoscopy Medicines:                Monitored Anesthesia Care Procedure:                Pre-Anesthesia Assessment:                           - Prior to the procedure, a History and Physical                            was performed, and patient medications and                            allergies were reviewed. The patient's tolerance of                            previous anesthesia was also reviewed. The risks                            and benefits of the procedure and the sedation                            options and risks were discussed with the patient.                            All questions were answered, and informed consent                            was obtained. Prior Anticoagulants: The patient has                            taken no previous anticoagulant or antiplatelet                            agents. ASA Grade Assessment: II - A patient with                            mild systemic disease. After reviewing the risks                            and benefits, the patient was deemed in                            satisfactory condition to undergo the procedure.  After obtaining informed consent, the colonoscope                            was passed under direct vision. Throughout the                            procedure, the patient's blood pressure, pulse, and                            oxygen saturations were monitored continuously. The                            Colonoscope was introduced through the anus and                            advanced to the the  terminal ileum. The colonoscopy                            was performed without difficulty. The patient                            tolerated the procedure well. The quality of the                            bowel preparation was excellent. The terminal                            ileum, ileocecal valve, appendiceal orifice, and                            rectum were photographed. Scope In: 8:11:01 AM Scope Out: 8:23:17 AM Scope Withdrawal Time: 0 hours 9 minutes 3 seconds  Total Procedure Duration: 0 hours 12 minutes 16 seconds  Findings:                 The perianal and digital rectal examinations were                            normal.                           The colon appeared normal.                           The retroflexed view of the distal rectum and anal                            verge was normal and showed no anal or rectal                            abnormalities.                           The terminal ileum appeared normal. Complications:            No immediate complications. Estimated Blood Loss:  Estimated blood loss: none. Impression:               - The entire examined colon is normal.                           - The distal rectum and anal verge are normal on                            retroflexion view.                           - The examined portion of the ileum was normal.                           - No specimens collected. Recommendation:           - Patient has a contact number available for                            emergencies. The signs and symptoms of potential                            delayed complications were discussed with the                            patient. Return to normal activities tomorrow.                            Written discharge instructions were provided to the                            patient.                           - Resume previous diet.                           - Continue present medications.                           -  Repeat colonoscopy in 10 years for screening                            purposes.                           - Return to GI office PRN. Gerrit Heck, MD 12/12/2020 8:29:07 AM

## 2020-12-12 NOTE — Progress Notes (Signed)
To PACU, VSS. Report to [email or web address] 

## 2020-12-12 NOTE — Progress Notes (Signed)
Pt's states no medical or surgical changes since previsit or office visit. 

## 2020-12-14 ENCOUNTER — Telehealth: Payer: Self-pay | Admitting: *Deleted

## 2020-12-14 NOTE — Telephone Encounter (Signed)
°  Follow up Call-  Call back number 12/12/2020  Post procedure Call Back phone  # [phone]  Permission to leave phone message Yes  Some recent data might be hidden     Patient questions:  Do you have a fever, pain , or abdominal swelling? No. Pain Score  0 *  Have you tolerated food without any problems? Yes.    Have you been able to return to your normal activities? Yes.    Do you have any questions about your discharge instructions: Diet   No. Medications  No. Follow up visit  No.  Do you have questions or concerns about your Care? No.  Actions: * If pain score is 4 or above: No action needed, pain <4.  1. Have you developed a fever since your procedure? no  2.   Have you had an respiratory symptoms (SOB or cough) since your procedure? no  3.   Have you tested positive for COVID 19 since your procedure no  4.   Have you had any family members/close contacts diagnosed with the COVID 19 since your procedure?  no   If yes to any of these questions please route to Joylene John, RN and Joella Prince, RN

## 2021-01-12 ENCOUNTER — Other Ambulatory Visit: Payer: Self-pay

## 2021-02-07 ENCOUNTER — Other Ambulatory Visit: Payer: Self-pay

## 2021-02-07 ENCOUNTER — Ambulatory Visit (INDEPENDENT_AMBULATORY_CARE_PROVIDER_SITE_OTHER): Payer: 59 | Admitting: Clinical

## 2021-02-07 DIAGNOSIS — F4322 Adjustment disorder with anxiety: Secondary | ICD-10-CM | POA: Diagnosis not present

## 2021-02-07 DIAGNOSIS — F1210 Cannabis abuse, uncomplicated: Secondary | ICD-10-CM | POA: Diagnosis not present

## 2021-02-07 DIAGNOSIS — Z634 Disappearance and death of family member: Secondary | ICD-10-CM | POA: Diagnosis not present
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
## 2021-02-14 ENCOUNTER — Other Ambulatory Visit: Payer: Self-pay

## 2021-02-14 ENCOUNTER — Ambulatory Visit (INDEPENDENT_AMBULATORY_CARE_PROVIDER_SITE_OTHER): Payer: 59 | Admitting: Clinical

## 2021-02-14 DIAGNOSIS — F4322 Adjustment disorder with anxiety: Secondary | ICD-10-CM | POA: Diagnosis not present
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
## 2021-02-28 ENCOUNTER — Other Ambulatory Visit: Payer: Self-pay

## 2021-02-28 ENCOUNTER — Ambulatory Visit (INDEPENDENT_AMBULATORY_CARE_PROVIDER_SITE_OTHER): Payer: 59 | Admitting: Clinical

## 2021-02-28 DIAGNOSIS — F4322 Adjustment disorder with anxiety: Secondary | ICD-10-CM | POA: Diagnosis not present

## 2021-02-28 NOTE — Progress Notes (Signed)
   THERAPIST PROGRESS NOTE  Session Time: 2pm  Participation Level: Active  Behavioral Response: CasualAlertEuthymic  Type of Therapy: Individual Therapy  Treatment Goals addressed: Anxiety  Interventions: CBT  Virtual Visit via Video Note  I connected with Helen Avila on 02/28/21 at  2:00 PM EST by a video enabled telemedicine application and verified that I am speaking with the correct person using two identifiers.  Location: Patient: home Provider: office   I discussed the limitations of evaluation and management by telemedicine and the availability of in person appointments. The patient expressed understanding and agreed to proceed.   I discussed the assessment and treatment plan with the patient. The patient was provided an opportunity to ask questions and all were answered. The patient agreed with the plan and demonstrated an understanding of the instructions.   The patient was advised to call back or seek an in-person evaluation if the symptoms worsen or if the condition fails to improve as anticipated.  I provided 45 minutes of non-face-to-face time during this encounter.  Summary: Helen Avila is a 50 y.o. female who presents in a euthymic mood. Pt reports she is looking forward to visiting a friend this week. Pt demonstrates good insight and understanding of the cycle of anxiety. Pt reports she has created a mantra that she chants repeatedly to minimize her anxiety. Pt states she is still engaging in physical activity and reports a improved sleep pattern. Pt says she is staying active during the day to avoid napping when she gets off of work. Pt making progress toward treatment goal as evidenced by healthy coping pattern and reduced anxiety.   Suicidal/Homicidal: Pt denies SI/HI, no plan or intent reported to harm self or others.  Therapist Response: CSW discussed and reviewed socratic questions to challenge anxious thinking. CSW processed with pt what will  happen vs what could happen. CSW provided pt with information on what anxiety is and how it grows. Processed with pt sxs of anxiety and coping methods to manage anxiety.  Plan: Return again in 1 weeks.  Diagnosis: Axis I: adjustment disorder with anxious mood   Axis II: No diagnosis    Helen Rack, LCSW 02/28/2021

## 2021-07-18 ENCOUNTER — Other Ambulatory Visit: Payer: Self-pay

## 2021-07-19 ENCOUNTER — Encounter: Payer: Self-pay | Admitting: Family Medicine

## 2021-07-19 ENCOUNTER — Ambulatory Visit (INDEPENDENT_AMBULATORY_CARE_PROVIDER_SITE_OTHER): Payer: Managed Care, Other (non HMO) | Admitting: Family Medicine

## 2021-07-19 VITALS — BP 140/92 | HR 87 | Temp 97.5°F | Ht 65.0 in | Wt 206.8 lb

## 2021-07-19 DIAGNOSIS — I10 Essential (primary) hypertension: Secondary | ICD-10-CM | POA: Diagnosis not present

## 2021-07-19 DIAGNOSIS — Z0000 Encounter for general adult medical examination without abnormal findings: Secondary | ICD-10-CM | POA: Diagnosis not present

## 2021-07-19 DIAGNOSIS — E559 Vitamin D deficiency, unspecified: Secondary | ICD-10-CM

## 2021-07-19 DIAGNOSIS — E782 Mixed hyperlipidemia: Secondary | ICD-10-CM

## 2021-07-19 LAB — LIPID PANEL
Cholesterol: 288 mg/dL — ABNORMAL HIGH (ref 0–200)
HDL: 41.9 mg/dL (ref >39.00)
LDL Cholesterol: 210 mg/dL — ABNORMAL HIGH (ref 0–99)
NonHDL: 245.65
Total CHOL/HDL Ratio: 7
Triglycerides: 176 mg/dL — ABNORMAL HIGH (ref 0.0–149.0)
VLDL: 35.2 mg/dL (ref 0.0–40.0)

## 2021-07-19 LAB — BASIC METABOLIC PANEL
BUN: 8 mg/dL (ref 6–23)
CO2: 23 mEq/L (ref 19–32)
Calcium: 9.4 mg/dL (ref 8.4–10.5)
Chloride: 106 mEq/L (ref 96–112)
Creatinine, Ser: 0.98 mg/dL (ref 0.40–1.20)
GFR: 67.84 mL/min (ref >60.00)
Glucose, Bld: 81 mg/dL (ref 70–99)
Potassium: 4 mEq/L (ref 3.5–5.1)
Sodium: 139 mEq/L (ref 135–145)

## 2021-07-19 LAB — CBC
HCT: 38.6 % (ref 36.0–46.0)
Hemoglobin: 13 g/dL (ref 12.0–15.0)
MCHC: 33.8 g/dL (ref 30.0–36.0)
MCV: 91.3 fl (ref 78.0–100.0)
Platelets: 271 10*3/uL (ref 150.0–400.0)
RBC: 4.23 Mil/uL (ref 3.87–5.11)
RDW: 12.8 % (ref 11.5–15.5)
WBC: 6.9 10*3/uL (ref 4.0–10.5)

## 2021-07-19 LAB — AST: AST: 17 U/L (ref 0–37)

## 2021-07-19 LAB — VITAMIN D 25 HYDROXY (VIT D DEFICIENCY, FRACTURES): VITD: 17.75 ng/mL — ABNORMAL LOW (ref 30.00–100.00)

## 2021-07-19 LAB — ALT: ALT: 17 U/L (ref 0–35)

## 2021-07-19 MED ORDER — HYDROCHLOROTHIAZIDE 25 MG PO TABS: 25.0000 mg | ORAL_TABLET | Freq: Every day | ORAL | 3 refills | Status: DC

## 2021-07-19 MED ORDER — ATORVASTATIN CALCIUM 40 MG PO TABS: 40.0000 mg | ORAL_TABLET | Freq: Every day | ORAL | 3 refills | Status: DC

## 2021-07-19 MED ORDER — LOSARTAN POTASSIUM 25 MG PO TABS: 12.5000 mg | ORAL_TABLET | Freq: Every day | ORAL | 3 refills | Status: DC

## 2021-07-19 NOTE — Progress Notes (Signed)
Helen Avila is a 50 y.o. female  Chief Complaint  Patient presents with   Annual Exam    CPE. No pap or breast exam needed.    HPI: Helen Avila is a 50 y.o. female patient seen today for annual CPE, fasting labs.   Last PAP: 12/2017 - normal PAP (+ trich, BV). Will schedule appt with GYN Dr. Elly Modena Last mammo: 06/2020 - [place] Last colonoscopy: 11/2020 - Dr. Gerrit Heck - due in 11/2030  Dental: has appt scheduled in 08/2021 Vision: UTD, just got glasses  Home BP 140/90  Med refills needed today? yes Pt to Rx Vit D but not taking OTC daily Vit D  Past Medical History:  Diagnosis Date   Allergy    seasonal    Anemia    Hyperlipidemia    Hypertension    Low serum vitamin D    Vitamin D deficiency     Past Surgical History:  Procedure Laterality Date   Child birth  [phone]   WISDOM TOOTH EXTRACTION  1992    Social History   Socioeconomic History   Marital status: Single    Spouse name: Not on file   Number of children: 2   Years of education: Assoc.    Highest education level: Not on file  Occupational History   Occupation: Customer Service    Employer: OTHER    Comment: [employer]  Tobacco Use   Smoking status: Some Days    Types: Cigars   Smokeless tobacco: Never   Tobacco comments:    not daily   Substance and Sexual Activity   Alcohol use: Yes    Alcohol/week: 0.0 standard drinks    Comment: wine once month   Drug use: Yes    Types: Marijuana    Comment: weekends   Sexual activity: Yes    Birth control/protection: Condom  Other Topics Concern   Not on file  Social History Narrative   Patient lives at family.   Caffeine Use: 5 cups daily   Social Determinants of Health   Financial Resource Strain: Not on file  Food Insecurity: Not on file  Transportation Needs: Not on file  Physical Activity: Not on file  Stress: Not on file  Social Connections: Not on file  Intimate Partner Violence: Not on file     Family History  Problem Relation Age of Onset   COPD Mother    Congestive Heart Failure Mother    Colon polyps Mother    Liver disease Father    Diabetes Daughter    Cancer Maternal Aunt        Breast cancer   Breast cancer Maternal Aunt    Cancer Paternal Grandmother        Breast cancer   Breast cancer Paternal Grandmother    Hypertension Neg Hx    Colon cancer Neg Hx    Esophageal cancer Neg Hx    Rectal cancer Neg Hx    Stomach cancer Neg Hx      Immunization History  Administered Date(s) Administered   Td 12/30/2010    Outpatient Encounter Medications as of 07/19/2021  Medication Sig   atorvastatin (LIPITOR) 40 MG tablet Take 1 tablet (40 mg total) by mouth daily.   hydrochlorothiazide (HYDRODIURIL) 25 MG tablet Take 1 tablet (25 mg total) by mouth daily.   losartan (COZAAR) 25 MG tablet Take 0.5 tablets (12.5 mg total) by mouth daily.   [DISCONTINUED] EPINEPHrine 0.3 mg/0.3 mL IJ  SOAJ injection Inject 0.3 mLs (0.3 mg total) into the muscle as needed. [facility] generic device ok. (Patient not taking: No sig reported)   [DISCONTINUED] Vitamin D, Ergocalciferol, (DRISDOL) 1.25 MG (50000 UNIT) CAPS capsule Take 1 capsule (50,000 Units total) by mouth every 7 (seven) days. (Patient not taking: Reported on 07/19/2021)   No facility-administered encounter medications on file as of 07/19/2021.     ROS: Gen: no fever, chills  Skin: no rash, itching ENT: no ear pain, ear drainage, nasal congestion, rhinorrhea, sinus pressure, sore throat Eyes: no blurry vision, double vision Resp: no cough, wheeze,SOB Breast: no breast tenderness, no nipple discharge, no breast masses CV: no CP, palpitations, LE edema,  GI: no heartburn, n/v/d/c, abd pain GU: no dysuria, urgency, frequency, hematuria MSK: no joint pain, myalgias, back pain Neuro: no dizziness, headache, weakness, vertigo Psych: no depression, anxiety, insomnia   Allergies  Allergen Reactions   Bee Venom Swelling    Shellfish Allergy Swelling    BP (!) 140/92 (BP Location: Left Arm, Patient Position: Sitting, Cuff Size: Normal)   Pulse 87   Temp (!) 97.5 F (36.4 C) (Temporal)   Ht '5\' 5"'$  (1.651 m)   Wt 206 lb 12.8 oz (93.8 kg)   LMP  (LMP Unknown)   SpO2 99%   BMI 34.41 kg/m  Wt Readings from Last 3 Encounters:  07/19/21 206 lb 12.8 oz (93.8 kg)  12/12/20 204 lb (92.5 kg)  11/28/20 204 lb (92.5 kg)   Temp Readings from Last 3 Encounters:  07/19/21 (!) 97.5 F (36.4 C) (Temporal)  12/12/20 98 F (36.7 C)  07/19/20 (!) 97.3 F (36.3 C) (Temporal)   BP Readings from Last 3 Encounters:  07/19/21 (!) 140/92  12/12/20 (!) 139/91  07/19/20 130/90   Pulse Readings from Last 3 Encounters:  07/19/21 87  12/12/20 63  07/19/20 (!) 102     Physical Exam Constitutional:      General: She is not in acute distress.    Appearance: She is well-developed.  HENT:     Head: Normocephalic and atraumatic.     Right Ear: Tympanic membrane and ear canal normal.     Left Ear: Tympanic membrane and ear canal normal.     Nose: Nose normal.     Mouth/Throat:     Mouth: Mucous membranes are moist.     Pharynx: Oropharynx is clear.  Eyes:     Conjunctiva/sclera: Conjunctivae normal.  Neck:     Thyroid: No thyromegaly.  Cardiovascular:     Rate and Rhythm: Normal rate and regular rhythm.     Pulses: Normal pulses.  Pulmonary:     Effort: Pulmonary effort is normal. No respiratory distress.     Breath sounds: Normal breath sounds. No wheezing or rhonchi.  Abdominal:     General: Bowel sounds are normal. There is no distension.     Palpations: Abdomen is soft. There is no mass.     Tenderness: There is no abdominal tenderness.  Musculoskeletal:     Cervical back: Neck supple.     Right lower leg: No edema.     Left lower leg: No edema.  Lymphadenopathy:     Cervical: No cervical adenopathy.  Skin:    General: Skin is warm and dry.  Neurological:     Mental Status: She is alert and oriented  to person, place, and time.     Motor: No abnormal muscle tone.     Coordination: Coordination normal.  Psychiatric:  Mood and Affect: Mood normal.        Behavior: Behavior normal.     A/P:  1. Annual physical exam - discussed importance of regular CV exercise, healthy diet, adequate sleep - pt to schedule with GYN for PAP and will schedule mammo; UTD on colonoscopy - UTD on dental and vision - immunizations UTD - labs per orders - next CPE in 1 year  2. Essential hypertension - slightly elevated at home and here - CBC - Basic metabolic panel Rx: - losartan (COZAAR) 25 MG tablet; Take 0.5 tablets (12.5 mg total) by mouth daily.  Dispense: 45 tablet; Refill: 3 Refill: - hydrochlorothiazide (HYDRODIURIL) 25 MG tablet; Take 1 tablet (25 mg total) by mouth daily.  Dispense: 90 tablet; Refill: 3 - check BP at home and f/u if > 140 / > 90  3. Mixed hyperlipidemia - Lipid panel - AST - ALT Refill: - atorvastatin (LIPITOR) 40 MG tablet; Take 1 tablet (40 mg total) by mouth daily.  Dispense: 90 tablet; Refill: 3  4. Vitamin D deficiency - pt took Rx Vit D 50,000IU weekly but not taking daily OTC - VITAMIN D 25 Hydroxy (Vit-D Deficiency, Fractures)    This visit occurred during the SARS-CoV-2 public health emergency.  Safety protocols were in place, including screening questions prior to the visit, additional usage of staff PPE, and extensive cleaning of exam room while observing appropriate contact time as indicated for disinfecting solutions.

## 2021-07-19 NOTE — Patient Instructions (Addendum)
Continue your atorvastatin  - cholesterol Continue HCTZ 25mg  daily Starting losartan 1/2 of 25mg  tabs (12.5mg ) daily  Health Maintenance, Female Adopting a healthy lifestyle and getting preventive care are important in promoting health and wellness. Ask your health care provider about: The right schedule for you to have regular tests and exams. Things you can do on your own to prevent diseases and keep yourself healthy. What should I know about diet, weight, and exercise? Eat a healthy diet  Eat a diet that includes plenty of vegetables, fruits, low-fat dairy products, and lean protein. Do not eat a lot of foods that are high in solid fats, added sugars, or sodium.  Maintain a healthy weight Body mass index (BMI) is used to identify weight problems. It estimates body fat based on height and weight. Your health care provider can help determineyour BMI and help you achieve or maintain a healthy weight. Get regular exercise Get regular exercise. This is one of the most important things you can do for your health. Most adults should: Exercise for at least 150 minutes each week. The exercise should increase your heart rate and make you sweat (moderate-intensity exercise). Do strengthening exercises at least twice a week. This is in addition to the moderate-intensity exercise. Spend less time sitting. Even light physical activity can be beneficial. Watch cholesterol and blood lipids Have your blood tested for lipids and cholesterol at 49 years of age, then havethis test every 5 years. Have your cholesterol levels checked more often if: Your lipid or cholesterol levels are high. You are older than 49 years of age. You are at high risk for heart disease. What should I know about cancer screening? Depending on your health history and family history, you may need to have cancer screening at various ages. This may include screening for: Breast cancer. Cervical cancer. Colorectal cancer. Skin  cancer. Lung cancer. What should I know about heart disease, diabetes, and high blood pressure? Blood pressure and heart disease High blood pressure causes heart disease and increases the risk of stroke. This is more likely to develop in people who have high blood pressure readings, are of African descent, or are overweight. Have your blood pressure checked: Every 3-5 years if you are 14-20 years of age. Every year if you are 77 years old or older. Diabetes Have regular diabetes screenings. This checks your fasting blood sugar level. Have the screening done: Once every three years after age 66 if you are at a normal weight and have a low risk for diabetes. More often and at a younger age if you are overweight or have a high risk for diabetes. What should I know about preventing infection? Hepatitis B If you have a higher risk for hepatitis B, you should be screened for this virus. Talk with your health care provider to find out if you are at risk forhepatitis B infection. Hepatitis C Testing is recommended for: Everyone born from [birth year] through 1964. Anyone with known risk factors for hepatitis C. Sexually transmitted infections (STIs) Get screened for STIs, including gonorrhea and chlamydia, if: You are sexually active and are younger than 49 years of age. You are older than 49 years of age and your health care provider tells you that you are at risk for this type of infection. Your sexual activity has changed since you were last screened, and you are at increased risk for chlamydia or gonorrhea. Ask your health care provider if you are at risk. Ask your health care provider about  whether you are at high risk for HIV. Your health care provider may recommend a prescription medicine to help prevent HIV infection. If you choose to take medicine to prevent HIV, you should first get tested for HIV. You should then be tested every 3 months for as long as you are taking the medicine. Pregnancy If  you are about to stop having your period (premenopausal) and you may become pregnant, seek counseling before you get pregnant. Take 400 to 800 micrograms (mcg) of folic acid every day if you become pregnant. Ask for birth control (contraception) if you want to prevent pregnancy. Osteoporosis and menopause Osteoporosis is a disease in which the bones lose minerals and strength with aging. This can result in bone fractures. If you are 52 years old or older, or if you are at risk for osteoporosis and fractures, ask your health care provider if you should: Be screened for bone loss. Take a calcium or vitamin D supplement to lower your risk of fractures. Be given hormone replacement therapy (HRT) to treat symptoms of menopause. Follow these instructions at home: Lifestyle Do not use any products that contain nicotine or tobacco, such as cigarettes, e-cigarettes, and chewing tobacco. If you need help quitting, ask your health care provider. Do not use street drugs. Do not share needles. Ask your health care provider for help if you need support or information about quitting drugs. Alcohol use Do not drink alcohol if: Your health care provider tells you not to drink. You are pregnant, may be pregnant, or are planning to become pregnant. If you drink alcohol: Limit how much you use to 0-1 drink a day. Limit intake if you are breastfeeding. Be aware of how much alcohol is in your drink. In the [place] one drink equals one 12 oz bottle of beer (355 mL), one 5 oz glass of wine (148 mL), or one 1 oz glass of hard liquor (44 mL). General instructions Schedule regular health, dental, and eye exams. Stay current with your vaccines. Tell your health care provider if: You often feel depressed. You have ever been abused or do not feel safe at home. Summary Adopting a healthy lifestyle and getting preventive care are important in promoting health and wellness. Follow your health care provider's  instructions about healthy diet, exercising, and getting tested or screened for diseases. Follow your health care provider's instructions on monitoring your cholesterol and blood pressure. This information is not intended to replace advice given to you by your health care provider. Make sure you discuss any questions you have with your healthcare provider. Document Revised: 12/09/2018 Document Reviewed: 12/09/2018 Elsevier Patient Education  2022 [employer].

## 2021-08-01 ENCOUNTER — Other Ambulatory Visit: Payer: Self-pay | Admitting: Family Medicine

## 2021-08-01 ENCOUNTER — Encounter: Payer: Self-pay | Admitting: Family Medicine

## 2021-08-01 DIAGNOSIS — E559 Vitamin D deficiency, unspecified: Secondary | ICD-10-CM

## 2021-08-01 MED ORDER — VITAMIN D (ERGOCALCIFEROL) 1.25 MG (50000 UNIT) PO CAPS: 50000.0000 [IU] | ORAL_CAPSULE | ORAL | 2 refills | Status: DC

## 2021-09-19 IMAGING — MG DIGITAL SCREENING BILAT W/ CAD
4 series · 4 of 4 positions shown · non-contrast
Comparison: Previous exam(s).

ACR Breast Density Category a: The breast tissue is almost entirely
fatty.

CLINICAL DATA: Screening.

EXAM:
DIGITAL SCREENING BILATERAL MAMMOGRAM WITH CAD

[L CC]
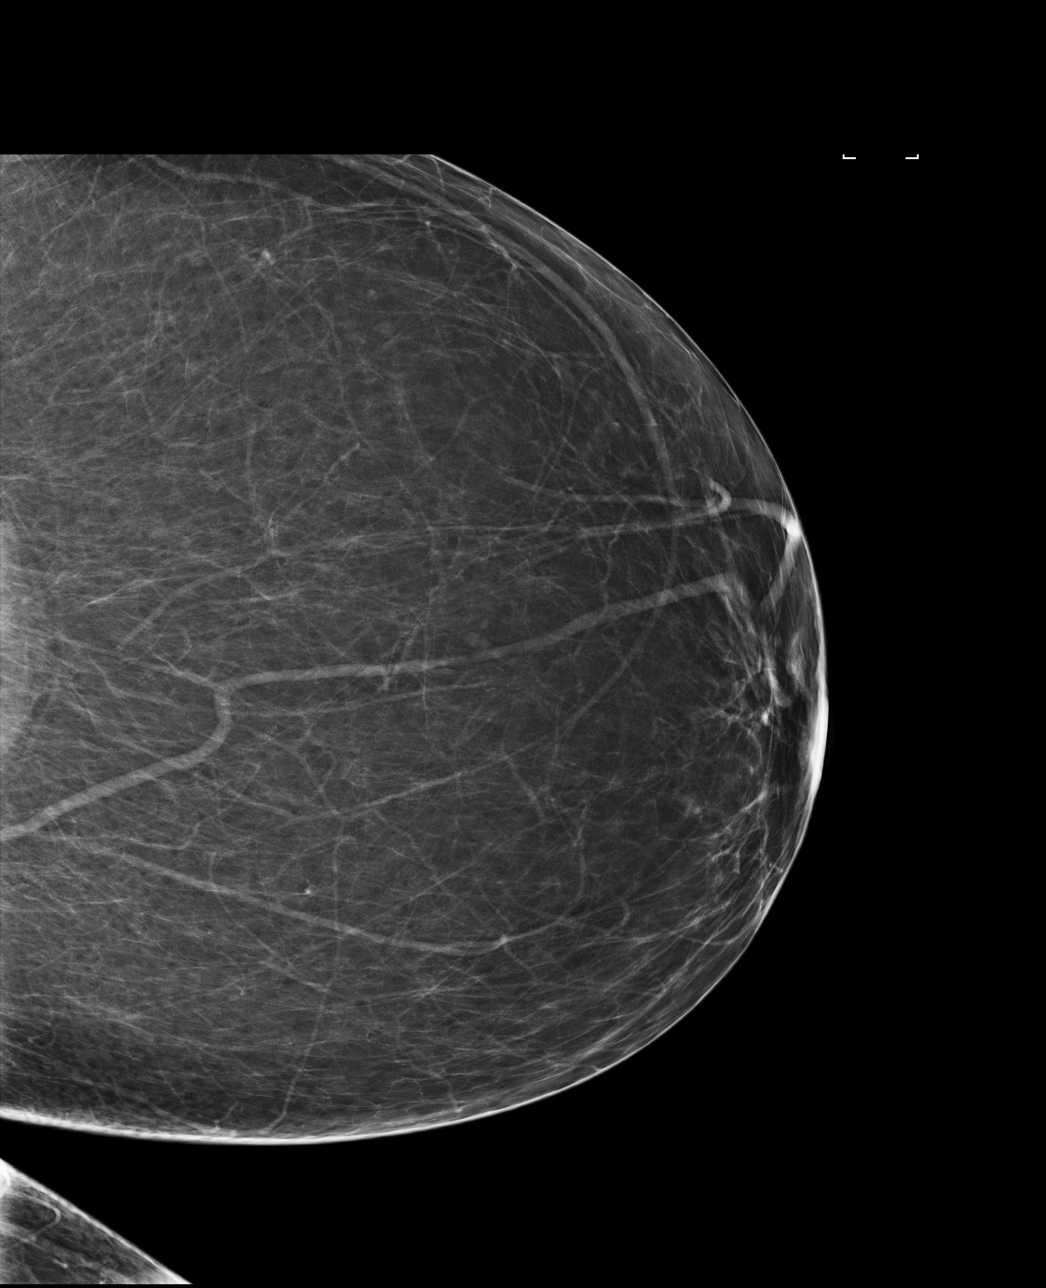

[R MLO]
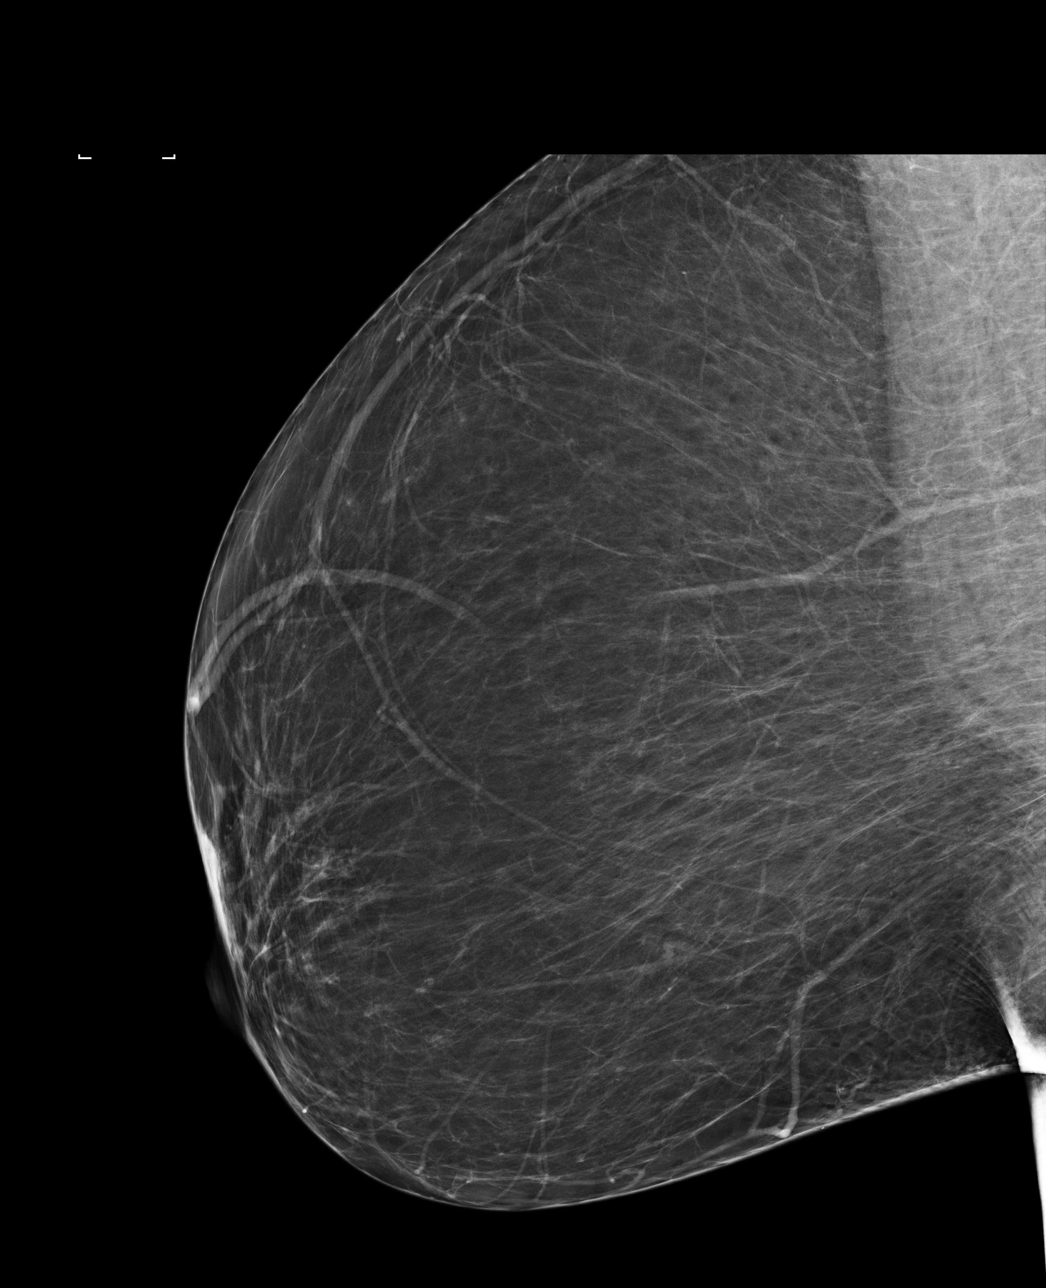

[L MLO]
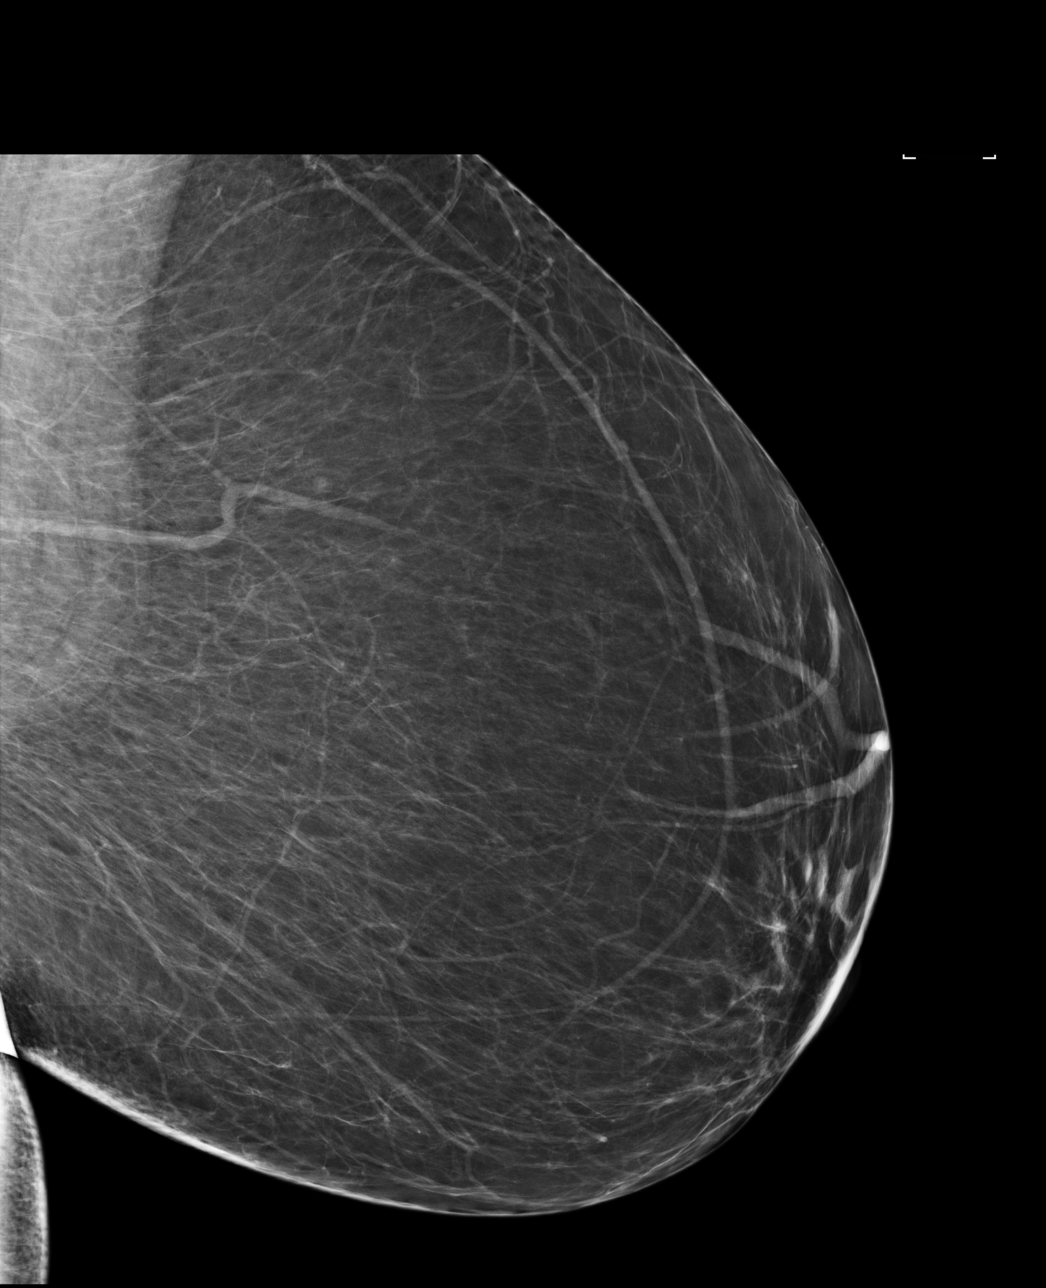

[R CC]
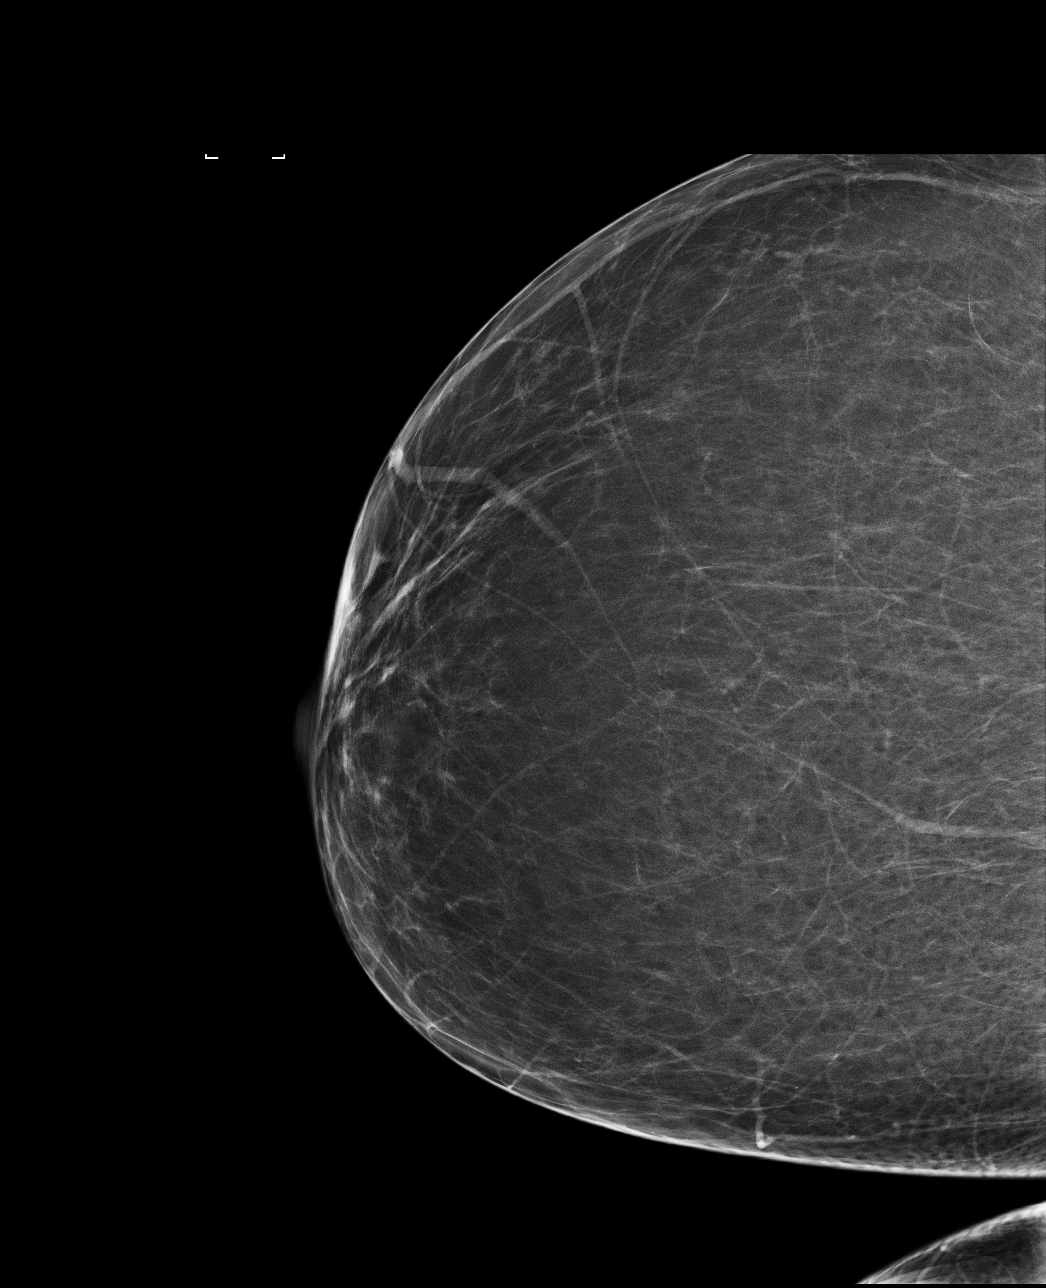

[4 of 4 positions shown; findings below may reference images not displayed]

FINDINGS: There are no findings suspicious for malignancy. Images were
processed with CAD.
IMPRESSION: No mammographic evidence of malignancy. A result letter of this
screening mammogram will be mailed directly to the patient.

RECOMMENDATION:
Screening mammogram in one year. (Code:[id number])

BI-RADS CATEGORY  1: Negative.

## 2022-07-29 ENCOUNTER — Ambulatory Visit: Payer: Managed Care, Other (non HMO) | Admitting: Family Medicine

## 2022-08-27 ENCOUNTER — Encounter: Payer: Self-pay | Admitting: Nurse Practitioner

## 2022-08-27 ENCOUNTER — Ambulatory Visit (INDEPENDENT_AMBULATORY_CARE_PROVIDER_SITE_OTHER): Payer: Managed Care, Other (non HMO) | Admitting: Nurse Practitioner

## 2022-08-27 VITALS — BP 160/102 | HR 76 | Temp 96.0°F | Wt 197.6 lb

## 2022-08-27 DIAGNOSIS — I10 Essential (primary) hypertension: Secondary | ICD-10-CM

## 2022-08-27 DIAGNOSIS — Z1231 Encounter for screening mammogram for malignant neoplasm of breast: Secondary | ICD-10-CM | POA: Diagnosis not present

## 2022-08-27 DIAGNOSIS — Z23 Encounter for immunization: Secondary | ICD-10-CM | POA: Diagnosis not present

## 2022-08-27 DIAGNOSIS — E559 Vitamin D deficiency, unspecified: Secondary | ICD-10-CM | POA: Insufficient documentation

## 2022-08-27 DIAGNOSIS — E782 Mixed hyperlipidemia: Secondary | ICD-10-CM

## 2022-08-27 LAB — HM HIV SCREENING LAB: HM HIV Screening: NEGATIVE

## 2022-08-27 LAB — HM HEPATITIS C SCREENING LAB: HM Hepatitis Screen: NEGATIVE

## 2022-08-27 MED ORDER — LOSARTAN POTASSIUM-HCTZ 50-12.5 MG PO TABS: 1.0000 | ORAL_TABLET | Freq: Every day | ORAL | 1 refills | Status: DC

## 2022-08-27 MED ORDER — ATORVASTATIN CALCIUM 40 MG PO TABS: 40.0000 mg | ORAL_TABLET | Freq: Every day | ORAL | 3 refills | Status: DC

## 2022-08-27 NOTE — Patient Instructions (Addendum)
It was great to see you!  We are checking your labs today and will let you know the results via mychart/phone.   Start the losartan-hctz (one pill for both blood pressure meds) daily. Keep checking your blood pressure at home. Keep up the great work at the gym!  I have placed an order for screening mammogram, they should call you to schedule. If you do not hear from them in the next week, please call:  [facility], [place], [place]  Let's follow-up in 2-3 weeks, sooner if you have concerns.  If a referral was placed today, you will be contacted for an appointment. Please note that routine referrals can sometimes take up to 3-4 weeks to process. Please call our office if you haven't heard anything after this time frame.  Take care,  Vance Peper, NP

## 2022-08-27 NOTE — Assessment & Plan Note (Addendum)
Chronic, not controlled.  BP today 160/102.  She states that she has been checking her blood pressure at home and it is been slightly elevated.  She also states that she has not been taking her medication every day and has not been taking the hydrochlorothiazide.  We will have her start losartan-HCTZ 50-12.5 mg daily to ensure that she gets both medications.  Encouraged her to try to remember to take her medications every day.  Continue checking her blood pressure at home.  Follow-up in 2 weeks.  Check CMP, CBC today

## 2022-08-27 NOTE — Assessment & Plan Note (Signed)
She has history of low vitamin D and was given prescription vitamin D supplement.  Now she is taking a multivitamin that has vitamin D in it.  We will check vitamin D levels and treat based on results.

## 2022-08-27 NOTE — Assessment & Plan Note (Signed)
She is taking atorvastatin 40 mg daily, she states that she has missed some of the doses.  Encouraged her to try to remember to take her medications every day.  Check lipid panel today.  Refill sent to the pharmacy.

## 2022-08-27 NOTE — Progress Notes (Signed)
New Patient Visit  BP (!) 160/102 (BP Location: Right Arm, Cuff Size: Large)   Pulse 76   Temp (!) 96 F (35.6 C) (Temporal)   Wt 197 lb 9.6 oz (89.6 kg)   LMP  (LMP Unknown)   SpO2 99%   BMI 32.88 kg/m    Subjective:    Patient ID: Helen Avila, female    DOB: Nov 26, 1971, 51 y.o.   MRN: [id number]  CC: Chief Complaint  Patient presents with   Transitions Of Care    Est care. No main concerns. Overall health assessment.     HPI: Helen Avila is a 51 y.o. female presents to transfer care to a new provider.  Introduced to Designer, jewellery role and practice setting.  All questions answered.  Discussed provider/patient relationship and expectations.  HYPERTENSION / HYPERLIPIDEMIA  Satisfied with current treatment? yes Duration of hypertension: chronic BP monitoring frequency: a few times a week BP range: "high" BP medication side effects: no Past BP meds: losartan, hctz Duration of hyperlipidemia: chronic Cholesterol medication side effects: no Cholesterol supplements: none Past cholesterol medications: atorvastatin Medication compliance: fair compliance Aspirin: no Recent stressors: no Recurrent headaches: no Visual changes: no Palpitations: no Dyspnea: no Chest pain: no Lower extremity edema:  intermittent ankle edema Dizzy/lightheaded: no    Past Medical History:  Diagnosis Date   Allergy    seasonal    Anemia    Hyperlipidemia    Hypertension    Low serum vitamin D    Vitamin D deficiency     Past Surgical History:  Procedure Laterality Date   Child birth  [id number]   WISDOM TOOTH EXTRACTION  53    Family History  Problem Relation Age of Onset   COPD Mother    Congestive Heart Failure Mother    Colon polyps Mother    Liver disease Father    Diabetes Daughter    Cancer Maternal Aunt        Breast cancer   Breast cancer Maternal Aunt    Cancer Paternal Grandmother        Breast cancer   Breast cancer Paternal Grandmother     Hypertension Neg Hx    Colon cancer Neg Hx    Esophageal cancer Neg Hx    Rectal cancer Neg Hx    Stomach cancer Neg Hx      Social History   Tobacco Use   Smoking status: Some Days    Types: Cigars   Smokeless tobacco: Never   Tobacco comments:    not daily   Substance Use Topics   Alcohol use: Yes    Alcohol/week: 0.0 standard drinks of alcohol    Comment: wine once month   Drug use: Yes    Types: Marijuana    Comment: weekends    Current Outpatient Medications on File Prior to Visit  Medication Sig Dispense Refill   dextromethorphan-guaiFENesin (MUCINEX DM) 30-600 MG 12hr tablet Take 1 tablet by mouth 2 (two) times daily as needed for cough.     fluticasone (FLONASE) 50 MCG/ACT nasal spray Place into both nostrils daily.     Multiple Vitamins-Minerals (MULTIVITAMIN WITH MINERALS) tablet Take 1 tablet by mouth daily.     No current facility-administered medications on file prior to visit.     Review of Systems  Constitutional: Negative.   HENT: Negative.    Eyes: Negative.   Respiratory: Negative.    Cardiovascular: Negative.   Gastrointestinal: Negative.   Genitourinary: Negative.  Musculoskeletal: Negative.   Neurological: Negative.   Psychiatric/Behavioral:  The patient is nervous/anxious.         Objective:    BP (!) 160/102 (BP Location: Right Arm, Cuff Size: Large)   Pulse 76   Temp (!) 96 F (35.6 C) (Temporal)   Wt 197 lb 9.6 oz (89.6 kg)   LMP  (LMP Unknown)   SpO2 99%   BMI 32.88 kg/m   Wt Readings from Last 3 Encounters:  08/27/22 197 lb 9.6 oz (89.6 kg)  07/19/21 206 lb 12.8 oz (93.8 kg)  12/12/20 204 lb (92.5 kg)    BP Readings from Last 3 Encounters:  08/27/22 (!) 160/102  07/19/21 (!) 140/92  12/12/20 (!) 139/91    Physical Exam Vitals and nursing note reviewed.  Constitutional:      General: She is not in acute distress.    Appearance: Normal appearance.  HENT:     Head: Normocephalic and atraumatic.  Eyes:      Conjunctiva/sclera: Conjunctivae normal.  Cardiovascular:     Rate and Rhythm: Normal rate and regular rhythm.     Pulses: Normal pulses.     Heart sounds: Normal heart sounds.  Pulmonary:     Effort: Pulmonary effort is normal.     Breath sounds: Normal breath sounds.  Abdominal:     Palpations: Abdomen is soft.     Tenderness: There is no abdominal tenderness.  Musculoskeletal:        General: Normal range of motion.     Cervical back: Normal range of motion and neck supple.     Right lower leg: No edema.     Left lower leg: No edema.  Lymphadenopathy:     Cervical: No cervical adenopathy.  Skin:    General: Skin is warm and dry.  Neurological:     General: No focal deficit present.     Mental Status: She is alert and oriented to person, place, and time.     Cranial Nerves: No cranial nerve deficit.     Coordination: Coordination normal.     Gait: Gait normal.  Psychiatric:        Mood and Affect: Mood normal.        Behavior: Behavior normal.        Thought Content: Thought content normal.        Judgment: Judgment normal.        Assessment & Plan:   Problem List Items Addressed This Visit       Cardiovascular and Mediastinum   Essential hypertension - Primary    Chronic, not controlled.  BP today 160/102.  She states that she has been checking her blood pressure at home and it is been slightly elevated.  She also states that she has not been taking her medication every day and has not been taking the hydrochlorothiazide.  We will have her start losartan-HCTZ 50-12.5 mg daily to ensure that she gets both medications.  Encouraged her to try to remember to take her medications every day.  Continue checking her blood pressure at home.  Follow-up in 2 weeks.  Check CMP, CBC today      Relevant Medications   losartan-hydrochlorothiazide (HYZAAR) 50-12.5 MG tablet   atorvastatin (LIPITOR) 40 MG tablet   Other Relevant Orders   CBC with Differential/Platelet    Comprehensive metabolic panel     Other   Mixed hyperlipidemia    She is taking atorvastatin 40 mg daily, she states that she has missed  some of the doses.  Encouraged her to try to remember to take her medications every day.  Check lipid panel today.  Refill sent to the pharmacy.      Relevant Medications   losartan-hydrochlorothiazide (HYZAAR) 50-12.5 MG tablet   atorvastatin (LIPITOR) 40 MG tablet   Other Relevant Orders   CBC with Differential/Platelet   Comprehensive metabolic panel   Lipid panel   Vitamin D deficiency    She has history of low vitamin D and was given prescription vitamin D supplement.  Now she is taking a multivitamin that has vitamin D in it.  We will check vitamin D levels and treat based on results.      Relevant Orders   VITAMIN D 25 Hydroxy (Vit-D Deficiency, Fractures)   Other Visit Diagnoses     Encounter for screening mammogram for malignant neoplasm of breast       Mammogram ordered today   Relevant Orders   MM 3D SCREEN BREAST BILATERAL   Need for Td vaccine       TD booster updated today   Relevant Orders   Td vaccine greater than or equal to 7yo preservative free IM (Completed)   Need for shingles vaccine       Shingrix No. 1 given today   Relevant Orders   Varicella-zoster vaccine IM (Completed)        Follow up plan: Return in about 3 weeks (around 09/17/2022) for 2-3 weeks , CPE.

## 2022-08-28 LAB — CBC WITH DIFFERENTIAL/PLATELET
Basophils Absolute: 0.1 10*3/uL (ref 0.0–0.2)
Basos: 1 %
EOS (ABSOLUTE): 0.2 10*3/uL (ref 0.0–0.4)
Eos: 3 %
Hematocrit: 40.9 % (ref 34.0–46.6)
Hemoglobin: 13.6 g/dL (ref 11.1–15.9)
Immature Grans (Abs): 0 10*3/uL (ref 0.0–0.1)
Immature Granulocytes: 0 %
Lymphocytes Absolute: 3.3 10*3/uL — ABNORMAL HIGH (ref 0.7–3.1)
Lymphs: 42 %
MCH: 30.2 pg (ref 26.6–33.0)
MCHC: 33.3 g/dL (ref 31.5–35.7)
MCV: 91 fL (ref 79–97)
Monocytes Absolute: 0.5 10*3/uL (ref 0.1–0.9)
Monocytes: 6 %
Neutrophils Absolute: 3.8 10*3/uL (ref 1.4–7.0)
Neutrophils: 48 %
Platelets: 300 10*3/uL (ref 150–450)
RBC: 4.51 x10E6/uL (ref 3.77–5.28)
RDW: 12 % (ref 11.7–15.4)
WBC: 7.8 10*3/uL (ref 3.4–10.8)

## 2022-08-28 LAB — LIPID PANEL
Chol/HDL Ratio: 5 ratio — ABNORMAL HIGH (ref 0.0–4.4)
Cholesterol, Total: 271 mg/dL — ABNORMAL HIGH (ref 100–199)
HDL: 54 mg/dL (ref >39)
LDL Chol Calc (NIH): 196 mg/dL — ABNORMAL HIGH (ref 0–99)
Triglycerides: 117 mg/dL (ref 0–149)
VLDL Cholesterol Cal: 21 mg/dL (ref 5–40)

## 2022-08-28 LAB — COMPREHENSIVE METABOLIC PANEL
ALT: 21 IU/L (ref 0–32)
AST: 22 IU/L (ref 0–40)
Albumin/Globulin Ratio: 1.8 (ref 1.2–2.2)
Albumin: 4.4 g/dL (ref 3.9–4.9)
Alkaline Phosphatase: 121 IU/L (ref 44–121)
BUN/Creatinine Ratio: 13 (ref 9–23)
BUN: 12 mg/dL (ref 6–24)
Bilirubin Total: 0.3 mg/dL (ref 0.0–1.2)
CO2: 25 mmol/L (ref 20–29)
Calcium: 9.8 mg/dL (ref 8.7–10.2)
Chloride: 108 mmol/L — ABNORMAL HIGH (ref 96–106)
Creatinine, Ser: 0.94 mg/dL (ref 0.57–1.00)
Globulin, Total: 2.5 g/dL (ref 1.5–4.5)
Glucose: 89 mg/dL (ref 70–99)
Potassium: 4.6 mmol/L (ref 3.5–5.2)
Sodium: 145 mmol/L — ABNORMAL HIGH (ref 134–144)
Total Protein: 6.9 g/dL (ref 6.0–8.5)
eGFR: 74 mL/min/{1.73_m2} (ref >59)

## 2022-08-28 LAB — VITAMIN D 25 HYDROXY (VIT D DEFICIENCY, FRACTURES): Vit D, 25-Hydroxy: 19.1 ng/mL — ABNORMAL LOW (ref 30.0–100.0)

## 2022-08-28 MED ORDER — ATORVASTATIN CALCIUM 80 MG PO TABS: 80.0000 mg | ORAL_TABLET | Freq: Every day | ORAL | 1 refills | Status: DC

## 2022-08-28 NOTE — Addendum Note (Signed)
Addended by: Vance Peper A on: 08/28/2022 08:16 AM   Modules accepted: Orders

## 2022-09-12 ENCOUNTER — Encounter: Payer: Self-pay | Admitting: Nurse Practitioner

## 2022-09-17 NOTE — Progress Notes (Unsigned)
LMP  (LMP Unknown)    Subjective:    Patient ID: Helen Avila, female    DOB: April 15, 1971, 51 y.o.   MRN: [id number]  CC: No chief complaint on file.  HPI: Helen Avila is a 51 y.o. female presenting on 09/18/2022 for comprehensive medical examination. Current medical complaints include:{Blank single:19197::"none","***"}  She currently lives with: Menopausal Symptoms: {Blank single:19197::"yes","no"}  Depression Screen done today and results listed below:     08/27/2022    9:48 AM 02/07/2021    9:09 AM  Depression screen PHQ 2/9  Decreased Interest 0   Down, Depressed, Hopeless 1   PHQ - 2 Score 1   Altered sleeping 0   Tired, decreased energy 0   Change in appetite 1   Feeling bad or failure about yourself  1   Trouble concentrating 0   Moving slowly or fidgety/restless 1   Suicidal thoughts 0   PHQ-9 Score 4   Difficult doing work/chores Somewhat difficult      Information is confidential and restricted. Go to Review Flowsheets to unlock data.    The patient {has/does not have:19849} a history of falls. I {did/did not:19850} complete a risk assessment for falls. A plan of care for falls {was/was not:19852} documented.   Past Medical History:  Past Medical History:  Diagnosis Date   Allergy    seasonal    Anemia    Hyperlipidemia    Hypertension    Low serum vitamin D    Vitamin D deficiency     Surgical History:  Past Surgical History:  Procedure Laterality Date   Child birth  [id number]   WISDOM TOOTH EXTRACTION  1991    Medications:  Current Outpatient Medications on File Prior to Visit  Medication Sig   atorvastatin (LIPITOR) 80 MG tablet Take 1 tablet (80 mg total) by mouth daily.   dextromethorphan-guaiFENesin (MUCINEX DM) 30-600 MG 12hr tablet Take 1 tablet by mouth 2 (two) times daily as needed for cough.   fluticasone (FLONASE) 50 MCG/ACT nasal spray Place into both nostrils daily.   losartan-hydrochlorothiazide (HYZAAR) 50-12.5 MG  tablet Take 1 tablet by mouth daily.   Multiple Vitamins-Minerals (MULTIVITAMIN WITH MINERALS) tablet Take 1 tablet by mouth daily.   No current facility-administered medications on file prior to visit.    Allergies:  Allergies  Allergen Reactions   Bee Venom Swelling   Shellfish Allergy Swelling    Social History:  Social History   Socioeconomic History   Marital status: Single    Spouse name: Not on file   Number of children: 2   Years of education: Assoc.    Highest education level: Not on file  Occupational History   Occupation: Customer Service    Employer: OTHER    Comment: [employer]  Tobacco Use   Smoking status: Some Days    Types: Cigars   Smokeless tobacco: Never   Tobacco comments:    not daily   Substance and Sexual Activity   Alcohol use: Yes    Alcohol/week: 0.0 standard drinks of alcohol    Comment: wine once month   Drug use: Yes    Types: Marijuana    Comment: weekends   Sexual activity: Yes    Birth control/protection: Condom  Other Topics Concern   Not on file  Social History Narrative   ** Merged History Encounter **       Social Determinants of Health   Financial Resource Strain: Not on file  Food  Insecurity: Not on file  Transportation Needs: Not on file  Physical Activity: Not on file  Stress: Not on file  Social Connections: Not on file  Intimate Partner Violence: Not on file   Social History   Tobacco Use  Smoking Status Some Days   Types: Cigars  Smokeless Tobacco Never  Tobacco Comments   not daily    Social History   Substance and Sexual Activity  Alcohol Use Yes   Alcohol/week: 0.0 standard drinks of alcohol   Comment: wine once month    Family History:  Family History  Problem Relation Age of Onset   COPD Mother    Congestive Heart Failure Mother    Colon polyps Mother    Liver disease Father    Diabetes Daughter    Cancer Maternal Aunt        Breast cancer   Breast cancer Maternal Aunt     Cancer Paternal Grandmother        Breast cancer   Breast cancer Paternal Grandmother    Hypertension Neg Hx    Colon cancer Neg Hx    Esophageal cancer Neg Hx    Rectal cancer Neg Hx    Stomach cancer Neg Hx     Past medical history, surgical history, medications, allergies, family history and social history reviewed with patient today and changes made to appropriate areas of the chart.   ROS All other ROS negative except what is listed above and in the HPI.      Objective:    LMP  (LMP Unknown)   Wt Readings from Last 3 Encounters:  08/27/22 197 lb 9.6 oz (89.6 kg)  07/19/21 206 lb 12.8 oz (93.8 kg)  12/12/20 204 lb (92.5 kg)    Physical Exam  Results for orders placed or performed in visit on 08/27/22  CBC with Differential/Platelet  Result Value Ref Range   WBC 7.8 3.4 - 10.8 x10E3/uL   RBC 4.51 3.77 - 5.28 x10E6/uL   Hemoglobin 13.6 11.1 - 15.9 g/dL   Hematocrit 40.9 34.0 - 46.6 %   MCV 91 79 - 97 fL   MCH 30.2 26.6 - 33.0 pg   MCHC 33.3 31.5 - 35.7 g/dL   RDW 12.0 11.7 - 15.4 %   Platelets 300 150 - 450 x10E3/uL   Neutrophils 48 Not Estab. %   Lymphs 42 Not Estab. %   Monocytes 6 Not Estab. %   Eos 3 Not Estab. %   Basos 1 Not Estab. %   Neutrophils Absolute 3.8 1.4 - 7.0 x10E3/uL   Lymphocytes Absolute 3.3 (H) 0.7 - 3.1 x10E3/uL   Monocytes Absolute 0.5 0.1 - 0.9 x10E3/uL   EOS (ABSOLUTE) 0.2 0.0 - 0.4 x10E3/uL   Basophils Absolute 0.1 0.0 - 0.2 x10E3/uL   Immature Granulocytes 0 Not Estab. %   Immature Grans (Abs) 0.0 0.0 - 0.1 x10E3/uL  Comprehensive metabolic panel  Result Value Ref Range   Glucose 89 70 - 99 mg/dL   BUN 12 6 - 24 mg/dL   Creatinine, Ser 0.94 0.57 - 1.00 mg/dL   eGFR 74 >59 mL/min/1.73   BUN/Creatinine Ratio 13 9 - 23   Sodium 145 (H) 134 - 144 mmol/L   Potassium 4.6 3.5 - 5.2 mmol/L   Chloride 108 (H) 96 - 106 mmol/L   CO2 25 20 - 29 mmol/L   Calcium 9.8 8.7 - 10.2 mg/dL   Total Protein 6.9 6.0 - 8.5 g/dL   Albumin 4.4 3.9 -  4.9 g/dL   Globulin, Total 2.5 1.5 - 4.5 g/dL   Albumin/Globulin Ratio 1.8 1.2 - 2.2   Bilirubin Total 0.3 0.0 - 1.2 mg/dL   Alkaline Phosphatase 121 44 - 121 IU/L   AST 22 0 - 40 IU/L   ALT 21 0 - 32 IU/L  Lipid panel  Result Value Ref Range   Cholesterol, Total 271 (H) 100 - 199 mg/dL   Triglycerides 117 0 - 149 mg/dL   HDL 54 >39 mg/dL   VLDL Cholesterol Cal 21 5 - 40 mg/dL   LDL Chol Calc (NIH) 196 (H) 0 - 99 mg/dL   Lipid Comment: Comment    Chol/HDL Ratio 5.0 (H) 0.0 - 4.4 ratio  VITAMIN D 25 Hydroxy (Vit-D Deficiency, Fractures)  Result Value Ref Range   Vit D, 25-Hydroxy 19.1 (L) 30.0 - 100.0 ng/mL  [facility] HIV SCREENING LAB  Result Value Ref Range   [facility] HIV Screening Negative - Patient reported   HM HEPATITIS C SCREENING LAB  Result Value Ref Range   [facility] Hepatitis Screen Negative - Patient Reported       Assessment & Plan:   Problem List Items Addressed This Visit   None    Follow up plan: No follow-ups on file.   LABORATORY TESTING:  - Pap smear: {Blank SEGBTD:17616::"WVP done","not applicable","up to date","done elsewhere"}  IMMUNIZATIONS:   - Tdap: Tetanus vaccination status reviewed: last tetanus booster within 10 years. - Influenza: {Blank single:19197::"Up to date","Administered today","Postponed to flu season","Refused","Given elsewhere"} - Pneumovax: Not applicable - Prevnar: Not applicable - HPV: Not applicable - Zostavax vaccine:  in process of getting series  SCREENING: -Mammogram:  scheduled 09/20/22   - Colonoscopy: Up to date  - Bone Density: Not applicable  -Hearing Test: Not applicable  -Spirometry: Not applicable   PATIENT COUNSELING:   Advised to take 1 mg of folate supplement per day if capable of pregnancy.   Sexuality: Discussed sexually transmitted diseases, partner selection, use of condoms, avoidance of unintended pregnancy  and contraceptive alternatives.   Advised to avoid cigarette smoking.  I discussed with the patient that  most people either abstain from alcohol or drink within safe limits (<=14/week and <=4 drinks/occasion for males, <=7/weeks and <= 3 drinks/occasion for females) and that the risk for alcohol disorders and other health effects rises proportionally with the number of drinks per week and how often a drinker exceeds daily limits.  Discussed cessation/primary prevention of drug use and availability of treatment for abuse.   Diet: Encouraged to adjust caloric intake to maintain  or achieve ideal body weight, to reduce intake of dietary saturated fat and total fat, to limit sodium intake by avoiding high sodium foods and not adding table salt, and to maintain adequate dietary potassium and calcium preferably from fresh fruits, vegetables, and low-fat dairy products.    stressed the importance of regular exercise  Injury prevention: Discussed safety belts, safety helmets, smoke detector, smoking near bedding or upholstery.   Dental health: Discussed importance of regular tooth brushing, flossing, and dental visits.    NEXT PREVENTATIVE PHYSICAL DUE IN 1 YEAR. No follow-ups on file.

## 2022-09-18 ENCOUNTER — Ambulatory Visit (INDEPENDENT_AMBULATORY_CARE_PROVIDER_SITE_OTHER): Payer: Managed Care, Other (non HMO) | Admitting: Nurse Practitioner

## 2022-09-18 ENCOUNTER — Encounter: Payer: Self-pay | Admitting: Nurse Practitioner

## 2022-09-18 VITALS — BP 124/88 | HR 100 | Temp 97.2°F | Wt 194.4 lb

## 2022-09-18 DIAGNOSIS — I10 Essential (primary) hypertension: Secondary | ICD-10-CM

## 2022-09-18 DIAGNOSIS — E782 Mixed hyperlipidemia: Secondary | ICD-10-CM

## 2022-09-18 DIAGNOSIS — E559 Vitamin D deficiency, unspecified: Secondary | ICD-10-CM

## 2022-09-18 DIAGNOSIS — Z0000 Encounter for general adult medical examination without abnormal findings: Secondary | ICD-10-CM | POA: Diagnosis not present

## 2022-09-18 NOTE — Assessment & Plan Note (Signed)
Chronic, improved.  BP today 124/88.  Continue losartan-hydrochlorothiazide 50-12.5 mg daily.  Continue checking blood pressure at home.  Follow-up in 3 months.

## 2022-09-18 NOTE — Assessment & Plan Note (Signed)
She has started a multivitamin that has extra vitamin D in it.  We will check vitamin D levels at next visit.

## 2022-09-18 NOTE — Assessment & Plan Note (Signed)
Cholesterol was not controlled on last set of labs.  Lipitor increased to 80 mg daily.  She is tolerating this well without any side effects.  We will have her follow-up in 3 months and recheck lipid panel and CMP at this time.

## 2022-09-18 NOTE — Patient Instructions (Signed)
It was great to see you!  Keep taking your medications and keep up the good work!   Let's follow-up in 3 months, sooner if you have concerns.  If a referral was placed today, you will be contacted for an appointment. Please note that routine referrals can sometimes take up to 3-4 weeks to process. Please call our office if you haven't heard anything after this time frame.  Take care,  Vance Peper, NP

## 2022-09-20 ENCOUNTER — Ambulatory Visit
Admission: RE | Admit: 2022-09-20 | Discharge: 2022-09-20 | Disposition: A | Discharge status: Home / Self Care | Payer: Managed Care, Other (non HMO) | Source: Ambulatory Visit | Attending: Nurse Practitioner | Admitting: Nurse Practitioner

## 2022-09-20 DIAGNOSIS — Z1231 Encounter for screening mammogram for malignant neoplasm of breast: Secondary | ICD-10-CM

## 2022-12-18 ENCOUNTER — Ambulatory Visit: Payer: Managed Care, Other (non HMO) | Admitting: Nurse Practitioner

## 2022-12-18 ENCOUNTER — Telehealth: Payer: Self-pay | Admitting: Nurse Practitioner

## 2022-12-18 NOTE — Telephone Encounter (Signed)
Pt was a no show on 12/18/22 for an OV with Lauren, I did not send a letter. She thinks she has covid, I did not speak with her.

## 2023-01-01 NOTE — Telephone Encounter (Signed)
Noted  

## 2023-01-01 NOTE — Telephone Encounter (Signed)
Did not mark as no show, fee waived

## 2023-01-06 NOTE — Progress Notes (Deleted)
   Established Patient Office Visit  Subjective   Patient ID: Helen Avila, female    DOB: August 19, 1971  Age: 52 y.o. MRN: [id number]  No chief complaint on file.   HPI  [name] is here to follow-up on hypertension and hyperlipidemia. Last visit her atorvastatin was increased to 80 mg daily.   {History (Optional):23778}  ROS    Objective:     LMP  (LMP Unknown)  {Vitals History (Optional):23777}  Physical Exam   No results found for any visits on 01/07/23.  {Labs (Optional):23779}  The 10-year ASCVD risk score (Arnett DK, et al., 2019) is: 8%    Assessment & Plan:   Problem List Items Addressed This Visit   None   No follow-ups on file.    Charyl Dancer, NP

## 2023-01-07 ENCOUNTER — Ambulatory Visit: Payer: Managed Care, Other (non HMO) | Admitting: Nurse Practitioner

## 2023-01-07 ENCOUNTER — Telehealth: Payer: Self-pay | Admitting: Nurse Practitioner

## 2023-01-07 DIAGNOSIS — I10 Essential (primary) hypertension: Secondary | ICD-10-CM

## 2023-01-07 DIAGNOSIS — E782 Mixed hyperlipidemia: Secondary | ICD-10-CM

## 2023-01-07 DIAGNOSIS — E559 Vitamin D deficiency, unspecified: Secondary | ICD-10-CM

## 2023-01-07 NOTE — Telephone Encounter (Signed)
Pt was a no show 1/09 for OV with Lauren. This is her first, letter sent

## 2023-01-14 NOTE — Telephone Encounter (Signed)
Noted  

## 2023-01-14 NOTE — Telephone Encounter (Signed)
1st no show, fee waived, letter sent 

## 2023-02-07 ENCOUNTER — Other Ambulatory Visit: Payer: Self-pay | Admitting: Nurse Practitioner

## 2024-02-09 ENCOUNTER — Other Ambulatory Visit: Payer: Self-pay | Admitting: Nurse Practitioner

## 2024-02-09 NOTE — Telephone Encounter (Signed)
Copied from CRM [id number]. Topic: Clinical - Medication Refill >> Feb 09, 2024 11:43 AM Truddie Crumble wrote: Most Recent Primary Care Visit:  Provider: Rodman Pickle A  Department: [facility]  Visit Type: PHYSICAL  Date: 09/18/2022  Medication: atorvastatin and lorsartan  Has the patient contacted their pharmacy? Yes (Agent: If no, request that the patient contact the pharmacy for the refill. If patient does not wish to contact the pharmacy document the reason why and proceed with request.) (Agent: If yes, when and what did the pharmacy advise?)  Is this the correct pharmacy for this prescription? Yes If no, delete pharmacy and type the correct one.  This is the patient's preferred pharmacy:  [id number] - [place], [place] - [address] AT [facility] [address] [place] [address] Phone: [phone] Fax: [phone]  Has the prescription been filled recently? No  Is the patient out of the medication? Yes  Has the patient been seen for an appointment in the last year OR does the patient have an upcoming appointment? Yes  Can we respond through MyChart? Yes  Agent: Please be advised that Rx refills may take up to 3 business days. We ask that you follow-up with your pharmacy.

## 2024-02-09 NOTE — Telephone Encounter (Signed)
 Copied from CRM [id number]. Topic: Clinical - Medication Refill >> Feb 09, 2024 11:43 AM Truddie Crumble wrote: Most Recent Primary Care Visit:  Provider: Rodman Pickle A  Department: [facility]  Visit Type: PHYSICAL  Date: 09/18/2022  Medication: atorvastatin and lorsartan  Has the patient contacted their pharmacy? Yes (Agent: If no, request that the patient contact the pharmacy for the refill. If patient does not wish to contact the pharmacy document the reason why and proceed with request.) (Agent: If yes, when and what did the pharmacy advise?)  Is this the correct pharmacy for this prescription? Yes If no, delete pharmacy and type the correct one.  This is the patient's preferred pharmacy:  [id number] - [place], [place] - [address] AT [facility] [address] [place] [address] Phone: [phone] Fax: [phone]  Has the prescription been filled recently? No  Is the patient out of the medication? Yes  Has the patient been seen for an appointment in the last year OR does the patient have an upcoming appointment? Yes  Can we respond through MyChart? Yes  Agent: Please be advised that Rx refills may take up to 3 business days. We ask that you follow-up with your pharmacy.

## 2024-02-10 MED ORDER — ATORVASTATIN CALCIUM 80 MG PO TABS: 80.0000 mg | ORAL_TABLET | Freq: Every day | ORAL | 0 refills | Status: AC

## 2024-02-10 MED ORDER — LOSARTAN POTASSIUM-HCTZ 50-12.5 MG PO TABS: 1.0000 | ORAL_TABLET | Freq: Every day | ORAL | 0 refills | Status: AC

## 2024-03-26 ENCOUNTER — Telehealth: Payer: Self-pay | Admitting: Nurse Practitioner

## 2024-03-26 ENCOUNTER — Encounter: Payer: Managed Care, Other (non HMO) | Admitting: Nurse Practitioner

## 2024-04-12 NOTE — Telephone Encounter (Signed)
 Noted.

## 2024-04-12 NOTE — Telephone Encounter (Signed)
 03/26/2024 1st no show w/in 12 months, letter sent via mychart

## 2024-05-10 ENCOUNTER — Other Ambulatory Visit: Payer: Self-pay | Admitting: Nurse Practitioner

## 2024-05-10 NOTE — Telephone Encounter (Signed)
 Requesting: LOSARTAN /HCTZ 50/12.5MG  TABLETS , ATORVASTATIN  80MG  TABLETS  Last Visit: 07/2022 Next Visit: Visit date not found Last Refill: 02/10/2024  Please Advise

## 2025-03-07 ENCOUNTER — Encounter: Admitting: Obstetrics & Gynecology
